# Patient Record
Sex: Male | Born: 1965 | Race: White | Hispanic: No | Smoking: Never smoker
Health system: Southern US, Community
[De-identification: ages and names within clinical notes are randomized; demographics above are authoritative.]

## PROBLEM LIST (undated history)

## (undated) DIAGNOSIS — I10 Essential (primary) hypertension: Secondary | ICD-10-CM

## (undated) DIAGNOSIS — M109 Gout, unspecified: Secondary | ICD-10-CM

## (undated) DIAGNOSIS — G4730 Sleep apnea, unspecified: Secondary | ICD-10-CM

## (undated) DIAGNOSIS — M1A09X0 Idiopathic chronic gout, multiple sites, without tophus (tophi): Secondary | ICD-10-CM

## (undated) DIAGNOSIS — F17200 Nicotine dependence, unspecified, uncomplicated: Secondary | ICD-10-CM

## (undated) DIAGNOSIS — E66812 Obesity, class 2: Secondary | ICD-10-CM

## (undated) DIAGNOSIS — R7989 Other specified abnormal findings of blood chemistry: Secondary | ICD-10-CM

## (undated) DIAGNOSIS — K760 Fatty (change of) liver, not elsewhere classified: Secondary | ICD-10-CM

## (undated) DIAGNOSIS — R7303 Prediabetes: Secondary | ICD-10-CM

## (undated) DIAGNOSIS — F419 Anxiety disorder, unspecified: Secondary | ICD-10-CM

## (undated) DIAGNOSIS — J342 Deviated nasal septum: Secondary | ICD-10-CM

## (undated) HISTORY — DX: Essential (primary) hypertension: I10

## (undated) HISTORY — DX: Gout, unspecified: M10.9

---

## 2008-07-30 ENCOUNTER — Ambulatory Visit: Payer: Self-pay | Admitting: Internal Medicine

## 2011-05-03 ENCOUNTER — Ambulatory Visit: Payer: Self-pay

## 2014-05-20 DIAGNOSIS — E669 Obesity, unspecified: Secondary | ICD-10-CM | POA: Insufficient documentation

## 2014-05-20 DIAGNOSIS — I8390 Asymptomatic varicose veins of unspecified lower extremity: Secondary | ICD-10-CM | POA: Insufficient documentation

## 2014-05-20 DIAGNOSIS — R030 Elevated blood-pressure reading, without diagnosis of hypertension: Secondary | ICD-10-CM | POA: Insufficient documentation

## 2014-05-20 DIAGNOSIS — E66812 Obesity, class 2: Secondary | ICD-10-CM | POA: Insufficient documentation

## 2014-06-01 DIAGNOSIS — E785 Hyperlipidemia, unspecified: Secondary | ICD-10-CM | POA: Insufficient documentation

## 2014-06-01 DIAGNOSIS — R7989 Other specified abnormal findings of blood chemistry: Secondary | ICD-10-CM | POA: Insufficient documentation

## 2014-07-09 ENCOUNTER — Ambulatory Visit: Payer: Self-pay | Admitting: Emergency Medicine

## 2015-06-02 LAB — HM HEPATITIS C SCREENING LAB: HM Hepatitis Screen: NEGATIVE

## 2016-04-18 DIAGNOSIS — G4733 Obstructive sleep apnea (adult) (pediatric): Secondary | ICD-10-CM | POA: Insufficient documentation

## 2016-04-18 LAB — COMPREHENSIVE METABOLIC PANEL
Albumin: 4.3 (ref 3.5–5.0)
Calcium: 9.3 (ref 8.7–10.7)

## 2016-04-18 LAB — CBC AND DIFFERENTIAL
HCT: 49 (ref 41–53)
Hemoglobin: 17.3 (ref 13.5–17.5)
Platelets: 221 10*3/uL (ref 150–400)
WBC: 5.4

## 2016-04-18 LAB — HEPATIC FUNCTION PANEL
ALT: 79 U/L — AB (ref 10–40)
AST: 61 — AB (ref 14–40)
Alkaline Phosphatase: 66 (ref 25–125)
Bilirubin, Direct: 0.29
Bilirubin, Total: 1.2

## 2016-04-18 LAB — TSH: TSH: 2.28 (ref 0.41–5.90)

## 2016-04-18 LAB — LIPID PANEL
Cholesterol: 260 — AB (ref 0–200)
HDL: 59 (ref 35–70)
LDL Cholesterol: 176
Triglycerides: 124 (ref 40–160)

## 2016-04-18 LAB — BASIC METABOLIC PANEL
BUN: 6 (ref 4–21)
CO2: 28 — AB (ref 13–22)
Chloride: 99 (ref 99–108)
Creatinine: 0.9 (ref 0.6–1.3)
Glucose: 118
Potassium: 4.1 mEq/L (ref 3.5–5.1)
Sodium: 141 (ref 137–147)

## 2016-04-18 LAB — CBC: RBC: 5.04 (ref 3.87–5.11)

## 2017-01-10 IMAGING — CR DG ANKLE COMPLETE 3+V*L*
3 series · 3 of 3 positions shown · non-contrast
Comparison: None.

CLINICAL DATA: Twisted ankle 2 weeks ago.  Ankle pain

EXAM:
LEFT ANKLE COMPLETE - 3+ VIEW

[ankle ap]
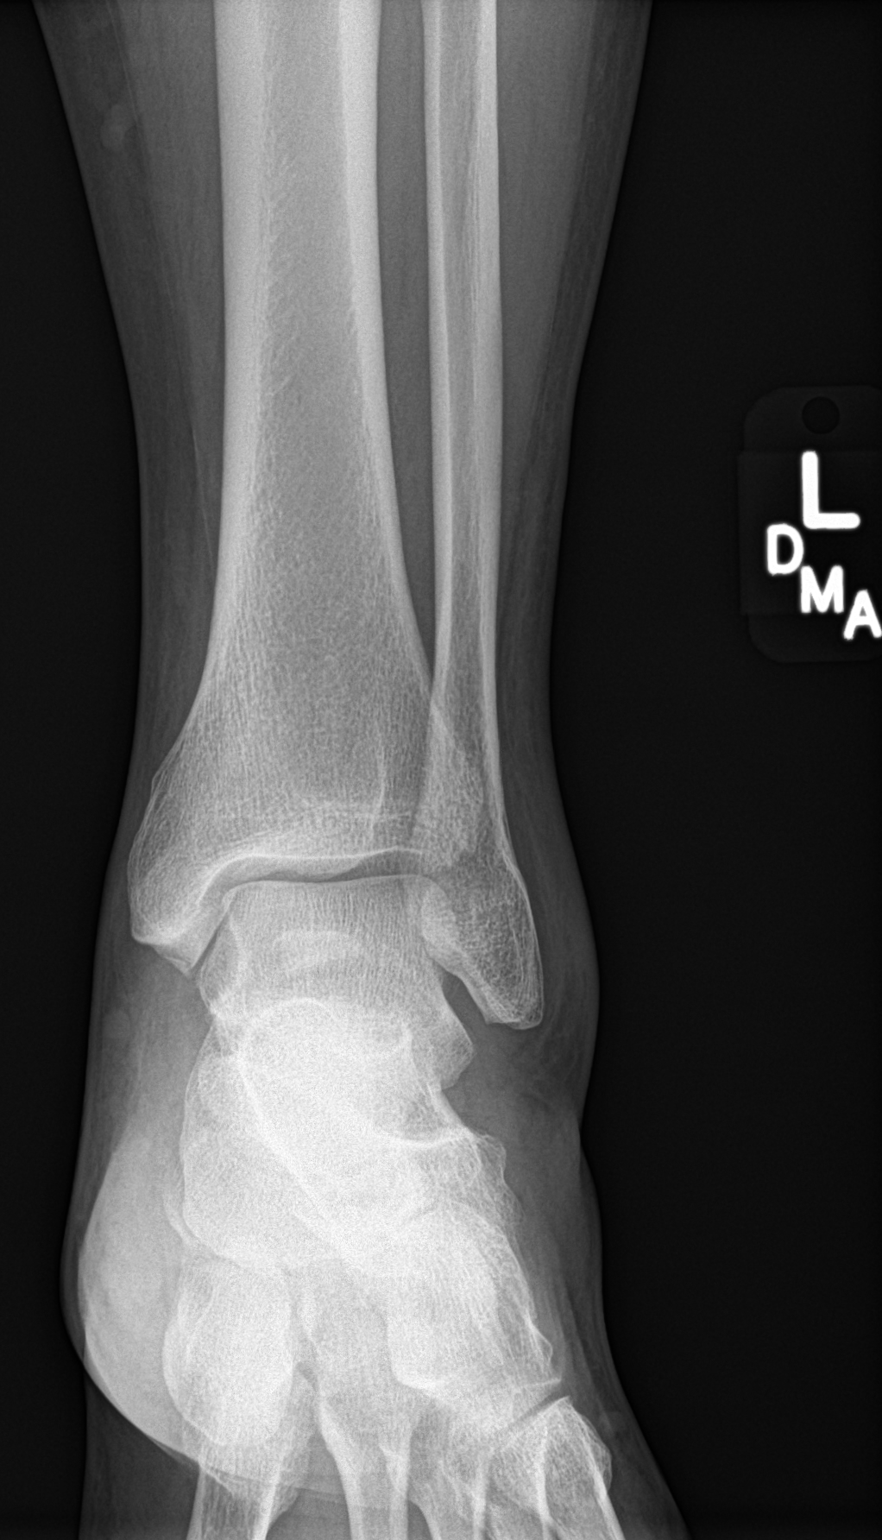

[ankle obl]
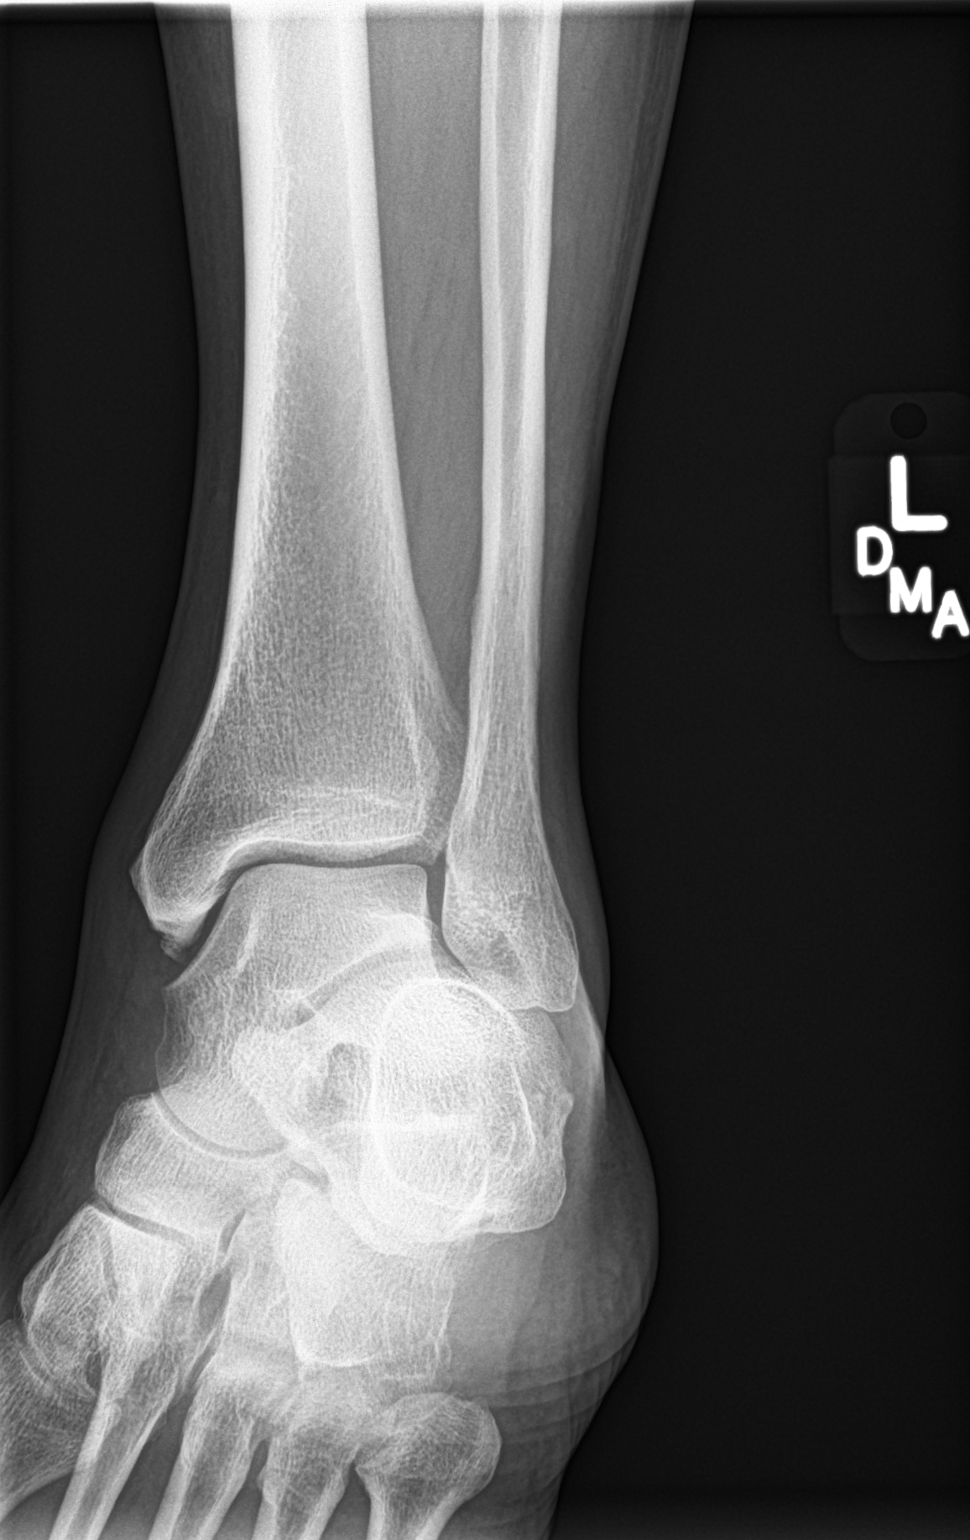

[ankle lat]
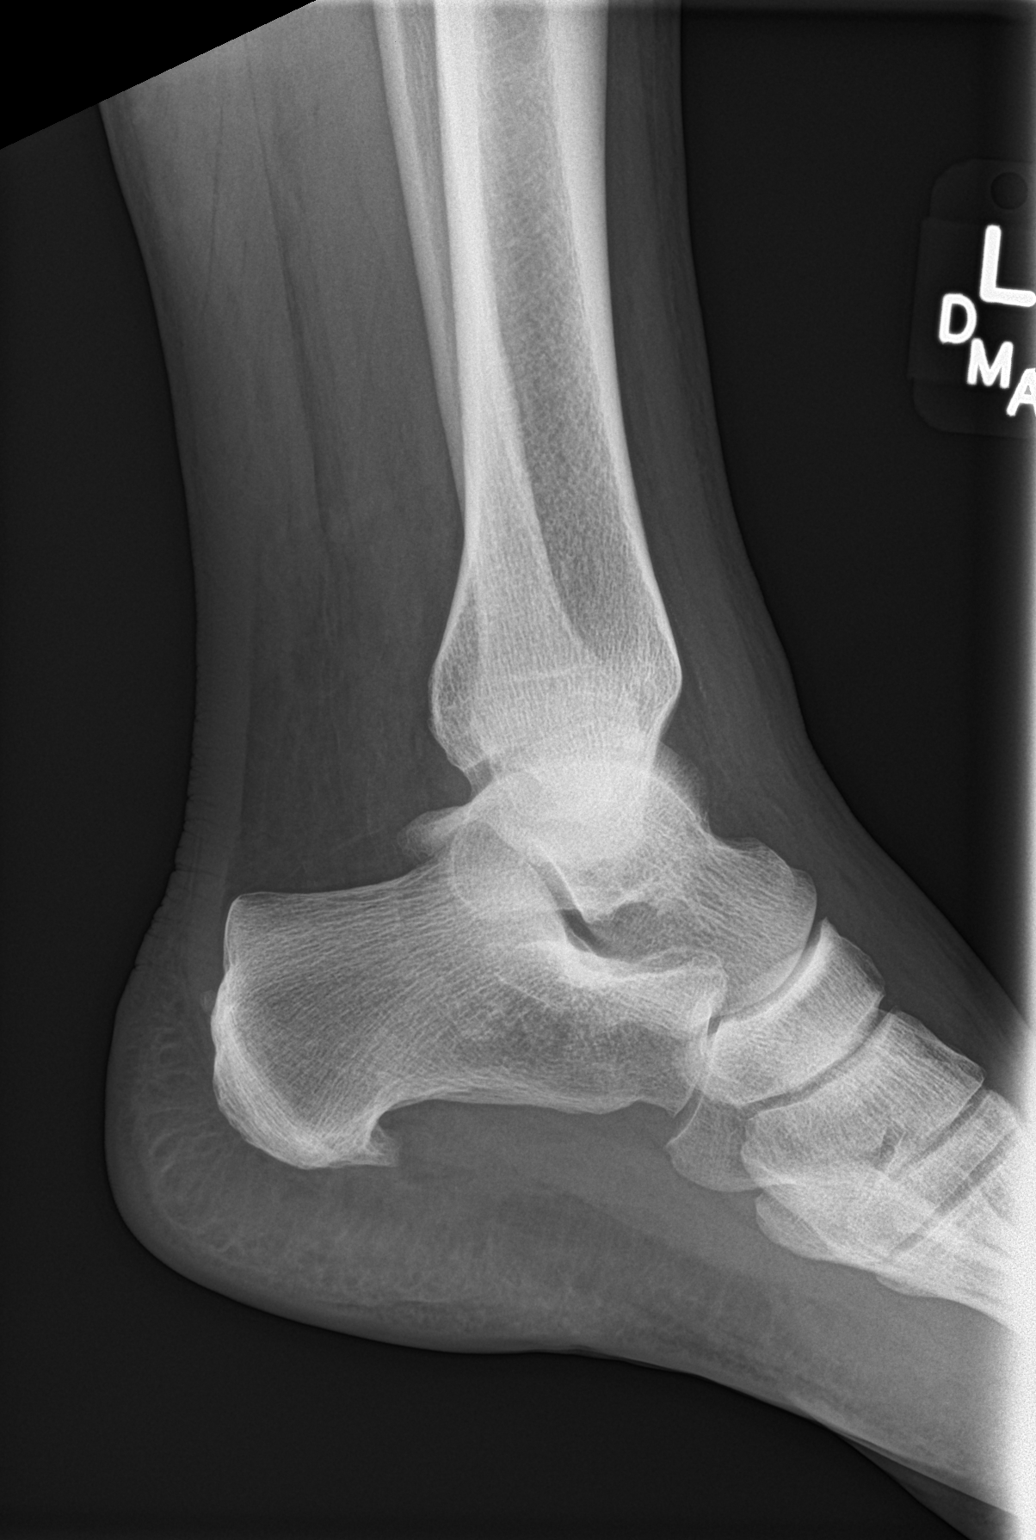

[3 of 3 positions shown; findings below may reference images not displayed]

FINDINGS: Small bony density adjacent to the medial malleolus likely is an
ossicle but correlate with pain in this area to exclude a fracture.
No effusion. No other fracture. Normal alignment no arthropathy.
IMPRESSION: Probable small ossicle adjacent to the medial malleolus. Recommend
attention to this area on exam.

## 2020-03-19 ENCOUNTER — Emergency Department
Admission: EM | Admit: 2020-03-19 | Discharge: 2020-03-19 | Disposition: A | Discharge status: Home / Self Care | Payer: PRIVATE HEALTH INSURANCE | Attending: Emergency Medicine | Admitting: Emergency Medicine

## 2020-03-19 DIAGNOSIS — F10129 Alcohol abuse with intoxication, unspecified: Secondary | ICD-10-CM | POA: Insufficient documentation

## 2020-03-19 DIAGNOSIS — E162 Hypoglycemia, unspecified: Secondary | ICD-10-CM | POA: Diagnosis not present

## 2020-03-19 DIAGNOSIS — F10920 Alcohol use, unspecified with intoxication, uncomplicated: Secondary | ICD-10-CM

## 2020-03-19 DIAGNOSIS — E860 Dehydration: Secondary | ICD-10-CM | POA: Diagnosis not present

## 2020-03-19 LAB — URINALYSIS, COMPLETE (UACMP) WITH MICROSCOPIC
Bacteria, UA: NONE SEEN
Bilirubin Urine: NEGATIVE
Glucose, UA: NEGATIVE mg/dL
Ketones, ur: NEGATIVE mg/dL
Leukocytes,Ua: NEGATIVE
Nitrite: NEGATIVE
Protein, ur: NEGATIVE mg/dL
Specific Gravity, Urine: 1.002 — ABNORMAL LOW (ref 1.005–1.030)
Squamous Epithelial / HPF: NONE SEEN (ref 0–5)
WBC, UA: NONE SEEN WBC/hpf (ref 0–5)
pH: 5 (ref 5.0–8.0)

## 2020-03-19 LAB — CBC WITH DIFFERENTIAL/PLATELET
Abs Immature Granulocytes: 0.03 10*3/uL (ref 0.00–0.07)
Basophils Absolute: 0.1 10*3/uL (ref 0.0–0.1)
Basophils Relative: 1 %
Eosinophils Absolute: 0.2 10*3/uL (ref 0.0–0.5)
Eosinophils Relative: 4 %
HCT: 46.2 % (ref 39.0–52.0)
Hemoglobin: 16.2 g/dL (ref 13.0–17.0)
Immature Granulocytes: 1 %
Lymphocytes Relative: 39 %
Lymphs Abs: 2.4 10*3/uL (ref 0.7–4.0)
MCH: 32.3 pg (ref 26.0–34.0)
MCHC: 35.1 g/dL (ref 30.0–36.0)
MCV: 92 fL (ref 80.0–100.0)
Monocytes Absolute: 0.6 10*3/uL (ref 0.1–1.0)
Monocytes Relative: 10 %
Neutro Abs: 2.7 10*3/uL (ref 1.7–7.7)
Neutrophils Relative %: 45 %
Platelets: 244 10*3/uL (ref 150–400)
RBC: 5.02 MIL/uL (ref 4.22–5.81)
RDW: 12.7 % (ref 11.5–15.5)
WBC: 6 10*3/uL (ref 4.0–10.5)
nRBC: 0 % (ref 0.0–0.2)

## 2020-03-19 LAB — COMPREHENSIVE METABOLIC PANEL
ALT: 16 U/L (ref 0–44)
AST: 24 U/L (ref 15–41)
Albumin: 4.4 g/dL (ref 3.5–5.0)
Alkaline Phosphatase: 52 U/L (ref 38–126)
Anion gap: 16 — ABNORMAL HIGH (ref 5–15)
BUN: 7 mg/dL (ref 6–20)
CO2: 19 mmol/L — ABNORMAL LOW (ref 22–32)
Calcium: 8.4 mg/dL — ABNORMAL LOW (ref 8.9–10.3)
Chloride: 98 mmol/L (ref 98–111)
Creatinine, Ser: 0.78 mg/dL (ref 0.61–1.24)
GFR calc Af Amer: >60 mL/min (ref >60)
GFR calc non Af Amer: >60 mL/min (ref >60)
Glucose, Bld: 104 mg/dL — ABNORMAL HIGH (ref 70–99)
Potassium: 3.4 mmol/L — ABNORMAL LOW (ref 3.5–5.1)
Sodium: 133 mmol/L — ABNORMAL LOW (ref 135–145)
Total Bilirubin: 0.8 mg/dL (ref 0.3–1.2)
Total Protein: 7.3 g/dL (ref 6.5–8.1)

## 2020-03-19 LAB — GLUCOSE, CAPILLARY: Glucose-Capillary: 91 mg/dL (ref 70–99)

## 2020-03-19 LAB — LIPASE, BLOOD: Lipase: 50 U/L (ref 11–51)

## 2020-03-19 LAB — ETHANOL: Alcohol, Ethyl (B): 264 mg/dL — ABNORMAL HIGH (ref <10)

## 2020-03-19 MED ORDER — SODIUM CHLORIDE 0.9 % IV BOLUS
1000.0000 mL | Freq: Once | INTRAVENOUS | Status: AC
Start: 2020-03-19 — End: 2020-03-19
  Administered 2020-03-19: 1000 mL via INTRAVENOUS

## 2020-03-19 NOTE — ED Triage Notes (Signed)
Patient found to be passed out in a drive thru at a local rest[employer]. Patient's sugar checked and was 53. Was given dextrose which raised his blood sugar to just over 100. Patient admits to drinking 6 beers this morning. Wife thinks he is an alcoholic.

## 2020-03-19 NOTE — ED Notes (Signed)
Dr Scotty Court to bedside to see pt. Pt up and ambulated down hall and back with no difficulty.

## 2020-03-19 NOTE — ED Provider Notes (Signed)
[facility] Emergency Department Provider Note  ____________________________________________  Time seen: Approximately 5:50 PM  I have reviewed the triage vital signs and the nursing notes.   HISTORY  Chief Complaint Alcohol Intoxication and Hypoglycemia    HPI Roger Huerta is a 54 y.o. male with no significant past medical history who was brought to the ED due to being found passed out in his car and a fast food drive-through line.  Blood sugar was checked by first responders and found to be 53.  He was given oral glucose which increased to about 100.  Denies any complaints, no chest pain shortness of breath abdominal pain vomiting or diarrhea.  Patient does note that he drinks about every other day.  He does not think that he is an alcoholic but notes that his wife does think so.  He reports drinking 6 beers this morning.  No smoking, no drug use.  Denies any blurry vision headache or dizziness currently.      History reviewed. No pertinent past medical history.   There are no problems to display for this patient.    History reviewed. No pertinent surgical history.   Prior to Admission medications   Not on File  None   Allergies Patient has no known allergies.   History reviewed. No pertinent family history.  Social History Social History   Tobacco Use  . Smoking status: Never Smoker  . Smokeless tobacco: Current User  Substance Use Topics  . Alcohol use: Yes    Alcohol/week: 6.0 standard drinks    Types: 6 Cans of beer per week    Comment: 6 beers daily  . Drug use: Not Currently    Review of Systems  Constitutional:   No fever or chills.  ENT:   No sore throat. No rhinorrhea. Cardiovascular:   No chest pain or syncope. Respiratory:   No dyspnea or cough. Gastrointestinal:   Negative for abdominal pain, vomiting and diarrhea.  Musculoskeletal:   Negative for focal pain or swelling All other systems reviewed and are  negative except as documented above in ROS and HPI.  ____________________________________________   PHYSICAL EXAM:  VITAL SIGNS: ED Triage Vitals  Enc Vitals Group     BP 03/19/20 1638 115/84     Pulse Rate 03/19/20 1638 79     Resp 03/19/20 1638 20     Temp 03/19/20 1638 99.1 F (37.3 C)     Temp Source 03/19/20 1638 Oral     SpO2 03/19/20 1638 98 %     Weight 03/19/20 1647 212 lb (96.2 kg)     Height 03/19/20 1647 5\' 7"  (1.702 m)     Head Circumference --      Peak Flow --      Pain Score 03/19/20 1639 0     Pain Loc --      Pain Edu? --      Excl. in GC? --     Vital signs reviewed, nursing assessments reviewed.   Constitutional:   Alert and oriented. Non-toxic appearance. Eyes:   Conjunctivae are normal. EOMI. PERRL. ENT      Head:   Normocephalic and atraumatic.      Nose:   Wearing a mask.      Mouth/Throat:   Wearing a mask.      Neck:   No meningismus. Full ROM. Hematological/Lymphatic/Immunilogical:   No cervical lymphadenopathy. Cardiovascular:   RRR. Symmetric bilateral radial and DP pulses.  No murmurs. Cap refill  less than 2 seconds. Respiratory:   Normal respiratory effort without tachypnea/retractions. Breath sounds are clear and equal bilaterally. No wheezes/rales/rhonchi. Gastrointestinal:   Soft and nontender. Non distended. There is no CVA tenderness.  No rebound, rigidity, or guarding.  Musculoskeletal:   Normal range of motion in all extremities. No joint effusions.  No lower extremity tenderness.  No edema. Neurologic:   Slurred speech.  Motor grossly intact. No acute focal neurologic deficits are appreciated.  Skin:    Skin is warm, dry and intact. No rash noted.  No petechiae, purpura, or bullae.  ____________________________________________    LABS (pertinent positives/negatives) (all labs ordered are listed, but only abnormal results are displayed) Labs Reviewed  COMPREHENSIVE METABOLIC PANEL - Abnormal; Notable for the following  components:      Result Value   Sodium 133 (*)    Potassium 3.4 (*)    CO2 19 (*)    Glucose, Bld 104 (*)    Calcium 8.4 (*)    Anion gap 16 (*)    All other components within normal limits  ETHANOL - Abnormal; Notable for the following components:   Alcohol, Ethyl (B) 264 (*)    All other components within normal limits  LIPASE, BLOOD  CBC WITH DIFFERENTIAL/PLATELET  URINALYSIS, COMPLETE (UACMP) WITH MICROSCOPIC   ____________________________________________   EKG    ____________________________________________    RADIOLOGY  No results found.  ____________________________________________   PROCEDURES Procedures  ____________________________________________    CLINICAL IMPRESSION / ASSESSMENT AND PLAN / ED COURSE  Medications ordered in the ED: Medications  sodium chloride 0.9 % bolus 1,000 mL (1,000 mLs Intravenous New Bag/Given 03/19/20 1704)    Pertinent labs & imaging results that were available during my care of the patient were reviewed by me and considered in my medical decision making (see chart for details).  Roger Huerta was evaluated in Emergency Department on 03/19/2020 for the symptoms described in the history of present illness. He was evaluated in the context of the global COVID-19 pandemic, which necessitated consideration that the patient might be at risk for infection with the SARS-CoV-2 virus that causes COVID-19. Institutional protocols and algorithms that pertain to the evaluation of patients at risk for COVID-19 are in a state of rapid change based on information released by regulatory bodies including the CDC and federal and state organizations. These policies and algorithms were followed during the patient's care in the ED.   Patient presents with hypoglycemia in the setting of alcohol binge drinking today, likely due to impaired gluconeogenesis and poor nutrition and dehydration.  Will check labs, give IV fluids for hydration and  food.  Will monitor for any signs of recurrent hypoglycemia.  Patient is not taking any hypoglycemic agents.   ----------------------------------------- 10:24 PM on 03/19/2020 -----------------------------------------  Blood sugar stable at 90.  Eating a second meal here in the ED, steady gait, no slurred speech now.  Stable for discharge home.  ____________________________________________   FINAL CLINICAL IMPRESSION(S) / ED DIAGNOSES    Final diagnoses:  Alcoholic intoxication without complication (HCC)  Dehydration     ED Discharge Orders    None      Portions of this note were generated with dragon dictation software. Dictation errors may occur despite best attempts at proofreading.   Sharman Cheek, MD 03/19/20 2224

## 2020-03-19 NOTE — ED Notes (Signed)
Patient tolerating IV fluids well. Denies need for anything at this time.

## 2022-12-27 ENCOUNTER — Ambulatory Visit (INDEPENDENT_AMBULATORY_CARE_PROVIDER_SITE_OTHER): Payer: Self-pay

## 2022-12-27 ENCOUNTER — Ambulatory Visit
Admission: EM | Admit: 2022-12-27 | Discharge: 2022-12-27 | Disposition: A | Discharge status: Home / Self Care | Payer: Self-pay | Attending: Physician Assistant | Admitting: Physician Assistant

## 2022-12-27 DIAGNOSIS — M25472 Effusion, left ankle: Secondary | ICD-10-CM

## 2022-12-27 DIAGNOSIS — M25562 Pain in left knee: Secondary | ICD-10-CM

## 2022-12-27 DIAGNOSIS — M25572 Pain in left ankle and joints of left foot: Secondary | ICD-10-CM

## 2022-12-27 DIAGNOSIS — M67873 Other specified disorders of tendon, right ankle and foot: Secondary | ICD-10-CM

## 2022-12-27 DIAGNOSIS — M109 Gout, unspecified: Secondary | ICD-10-CM

## 2022-12-27 MED ORDER — PREDNISONE 10 MG PO TABS: ORAL_TABLET | ORAL | 0 refills | Status: DC

## 2022-12-27 NOTE — ED Provider Notes (Signed)
[facility]    CSN: [id number] Arrival date & time: 12/27/22  1345      History   Chief Complaint Chief Complaint  Patient presents with   Ankle Pain    RT ankle    Knee Pain    LT knee    HPI Roger Huerta is a 57 y.o. male  presenting with his sister for multiple concerns.   Patient reports pain of right ankle pain x 6 weeks. Ankle pain is currently 5-6/10 but can get up to 8/10.  Pain worse with the foot dangling.  Patient better in full dorsiflexion.  Reports tingling of both feet.  Reports intermittent swelling of both ankles.  Reports he has been using a CAM boot and walker.   Reports left knee pain for about 5 weeks. Swelling comes and goes.  Patient denies any pain in his left knee at this time.  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  He says he thinks he could have twisted his ankle in the process of doing this.  He reports that he has not been working in the past 6 weeks since he started to have the pain.  He does have history of gout with last flare up 10 years ago.  He does report drinking alcohol every day.  Reports drinking several beers each day.  Reports drinking a 12 pack on the weekends.  History of fatty liver diagnosed about 7 years ago.  Has not followed up with primary care since.  Denies taking any OTC meds for symptoms.   HPI  History reviewed. No pertinent past medical history.  There are no problems to display for this patient.   History reviewed. No pertinent surgical history.     Home Medications    Prior to Admission medications   Medication Sig Start Date End Date Taking? Authorizing Provider  predniSONE (DELTASONE) 10 MG tablet Take 6 tabs po on day 1 and decrease daily until complete 12/27/22  Yes Shirlee Latch, PA-C    Family History No family history on file.  Social History Social History   Tobacco Use   Smoking status: Never   Smokeless tobacco: Current  Substance Use Topics    Alcohol use: Yes    Alcohol/week: 6.0 standard drinks of alcohol    Types: 6 Cans of beer per week    Comment: 6 beers daily   Drug use: Not Currently     Allergies   Hydrochlorothiazide   Review of Systems Review of Systems  Respiratory:  Negative for shortness of breath.   Cardiovascular:  Negative for chest pain.  Musculoskeletal:  Positive for arthralgias, gait problem and joint swelling. Negative for back pain.  Skin:  Positive for color change. Negative for wound.  Neurological:  Negative for weakness and numbness.     Physical Exam Triage Vital Signs ED Triage Vitals [12/27/22 1407]  Enc Vitals Group     BP (!) 143/92     Pulse Rate 92     Resp      Temp 98.4 F (36.9 C)     Temp Source Oral     SpO2 94 %     Weight      Height      Head Circumference      Peak Flow      Pain Score      Pain Loc      Pain Edu?      Excl. in GC?  No data found.  Updated Vital Signs BP (!) 143/92 (BP Location: Left Arm)   Pulse 92   Temp 98.4 F (36.9 C) (Oral)   SpO2 94%    Physical Exam Vitals and nursing note reviewed.  Constitutional:      General: He is not in acute distress.    Appearance: Normal appearance. He is well-developed. He is not ill-appearing.  HENT:     Head: Normocephalic and atraumatic.  Eyes:     General: No scleral icterus.    Conjunctiva/sclera: Conjunctivae normal.  Cardiovascular:     Rate and Rhythm: Normal rate and regular rhythm.     Pulses: Normal pulses.  Pulmonary:     Effort: Pulmonary effort is normal.  Musculoskeletal:     Cervical back: Neck supple.     Comments: Right ankle/foot.  Tenderness of medial and lateral malleoli.  Tenderness of insertion of Achilles.  Tenderness of first MTP with erythema.  Mild swelling of ankle.  Left knee: No swelling or tenderness and full range of motion.  Left ankle/foot: TTP of medial and lateral malleoli and plantar foot.  Skin:    General: Skin is warm and dry.     Capillary  Refill: Capillary refill takes less than 2 seconds.  Neurological:     General: No focal deficit present.     Mental Status: He is alert. Mental status is at baseline.     Motor: No weakness.     Gait: Gait abnormal (in a CAM boot).  Psychiatric:        Mood and Affect: Mood normal. Affect is flat.        Behavior: Behavior normal.      UC Treatments / Results  Labs (all labs ordered are listed, but only abnormal results are displayed) Labs Reviewed - No data to display  EKG   Radiology DG Ankle Complete Right  Result Date: 12/27/2022 CLINICAL DATA:  Left ankle twisting injury 6 weeks ago EXAM: RIGHT ANKLE - COMPLETE 3+ VIEW COMPARISON:  None available FINDINGS: No fracture or dislocation. Mild circumferential ankle swelling. Mild degenerative spurring of the medial and lateral malleoli. Enthesopathic changes are seen at the insertion of the plantar fascia and Achilles tendon on the calcaneus. Mild thickening of the distal Achilles tendon suspicious for tendinosis. Atherosclerotic changes seen throughout visualized arterial segments. IMPRESSION: Mild soft tissue swelling of the ankle without underlying fracture or dislocation. Electronically Signed   By: Acquanetta Belling M.D.   On: 12/27/2022 15:14   DG Knee Complete 4 Views Left  Result Date: 12/27/2022 CLINICAL DATA:  Left knee pain for 2 months EXAM: LEFT KNEE - COMPLETE 4+ VIEW COMPARISON:  None available FINDINGS: No fracture or dislocation. Atherosclerotic changes seen throughout visualized arterial segments. Joint spaces are maintained. IMPRESSION: 1. No fracture or dislocation. 2. No significant degenerative changes. Electronically Signed   By: Acquanetta Belling M.D.   On: 12/27/2022 15:12    Procedures Procedures (including critical care time)  Medications Ordered in UC Medications - No data to display  Initial Impression / Assessment and Plan / UC Course  I have reviewed the triage vital signs and the nursing notes.  Pertinent  labs & imaging results that were available during my care of the patient were reviewed by me and considered in my medical decision making (see chart for details).   57 year old male with history of alcohol abuse, gout, hyperlipidemia and fatty liver presents for intermittent swelling and pain of bilateral ankles and feet.  Currently reporting significant pain of the right ankle and foot over the past 6 weeks.  Denies known injury.  Has also been having pain and swelling intermittently of the left knee.  Not currently having knee pain.  On exam he does have swelling of bilateral ankles and tenderness of medial and lateral malleoli, insertion of right Achilles tendon and plantar left foot.  No tenderness or swelling of the left knee.  X-ray of right ankle and left knee obtained today.  No significant degenerative changes noted.  X-ray does show mild soft tissue swelling of the right ankle with mild thickening of the distal Achilles tendon which is suspicious for tendinosis.  He also has atherosclerotic changes in the arterial segment seen on the right ankle x-ray.  Reviewed results with patient and his sister.  Will treat him at this time for suspected gout flareup and Achilles tendinosis with prednisone taper.  Also advised Tylenol, RICE guidelines.  Advised the importance of cutting back on alcohol use.  Discussed following up with a primary care provider so that he may start something to help him quit drinking.  He is agreeable and nursing staff made him an appointment for primary care in a week.  Discussed that he may continue to be in gout flareups if he continues to drink.  Advised him to start folate and B12 supplement since he does have chronic alcohol use.  Advised to keep the appointment with PCP as they will likely draw routine labs.  Advised going to orthopedics sooner if his orthopedic complaints are worsening.   Final Clinical Impressions(s) / UC Diagnoses   Final diagnoses:  Acute gout of  right ankle, unspecified cause  Acute gout of right foot, unspecified cause  Acute pain of left knee  Pain and swelling of left ankle  Achilles tendinosis of right ankle     Discharge Instructions      -The x-ray does not show any fractures. - It does show that you have inflammation of the Achilles tendon of the right ankle.  No significant arthritis. - Symptoms may also be consistent with gout flareup.  We discussed that alcohol is a major trigger and you do consume alcohol frequently.  Try your best to decrease your consumption of alcohol.  Follow-up with her primary care provider to discuss her options with trying to stop drinking.  You also need to have routine lab work since it has been a while since you have seen a primary care provider. Start vitamin b12 and folate supplements over the counter. - I sent corticosteroids to the pharmacy to help with swelling/gout flareup.  Ice and elevate extremities.  May also take Tylenol for pain relief. - Follow-up with orthopedics if symptoms are worsening or not improving over the next week.     ED Prescriptions     Medication Sig Dispense Auth. Provider   predniSONE (DELTASONE) 10 MG tablet Take 6 tabs po on day 1 and decrease daily until complete 21 tablet Shirlee Latch, PA-C      PDMP not reviewed this encounter.   Shirlee Latch, PA-C 12/27/22 1539

## 2022-12-27 NOTE — ED Triage Notes (Addendum)
Pt presents to UC c/o RT ankle pain, pt states he sprained it about x6 weeks ago. Pt states he is still having pain. Denies any swelling. Pt is wearing a boot that he had from when he injured his heel years ago.  Pt also c/o LT knee pain, swelling onset mid May, pt states the swelling is not as bad today.

## 2022-12-27 NOTE — Discharge Instructions (Addendum)
-  The x-ray does not show any fractures. - It does show that you have inflammation of the Achilles tendon of the right ankle.  No significant arthritis. - Symptoms may also be consistent with gout flareup.  We discussed that alcohol is a major trigger and you do consume alcohol frequently.  Try your best to decrease your consumption of alcohol.  Follow-up with her primary care provider to discuss her options with trying to stop drinking.  You also need to have routine lab work since it has been a while since you have seen a primary care provider. Start vitamin b12 and folate supplements over the counter. - I sent corticosteroids to the pharmacy to help with swelling/gout flareup.  Ice and elevate extremities.  May also take Tylenol for pain relief. - Follow-up with orthopedics if symptoms are worsening or not improving over the next week.

## 2023-01-03 ENCOUNTER — Ambulatory Visit (INDEPENDENT_AMBULATORY_CARE_PROVIDER_SITE_OTHER): Payer: Self-pay | Admitting: Physician Assistant

## 2023-01-03 ENCOUNTER — Encounter: Payer: Self-pay | Admitting: Physician Assistant

## 2023-01-03 VITALS — BP 110/78 | HR 78 | Temp 98.1°F | Ht 67.0 in | Wt 215.0 lb

## 2023-01-03 DIAGNOSIS — F1020 Alcohol dependence, uncomplicated: Secondary | ICD-10-CM

## 2023-01-03 DIAGNOSIS — M1A09X0 Idiopathic chronic gout, multiple sites, without tophus (tophi): Secondary | ICD-10-CM

## 2023-01-03 DIAGNOSIS — Z13220 Encounter for screening for lipoid disorders: Secondary | ICD-10-CM

## 2023-01-03 DIAGNOSIS — K760 Fatty (change of) liver, not elsewhere classified: Secondary | ICD-10-CM

## 2023-01-03 DIAGNOSIS — E785 Hyperlipidemia, unspecified: Secondary | ICD-10-CM

## 2023-01-03 DIAGNOSIS — R453 Demoralization and apathy: Secondary | ICD-10-CM

## 2023-01-03 DIAGNOSIS — F1010 Alcohol abuse, uncomplicated: Secondary | ICD-10-CM | POA: Insufficient documentation

## 2023-01-03 DIAGNOSIS — Z6833 Body mass index (BMI) 33.0-33.9, adult: Secondary | ICD-10-CM

## 2023-01-03 DIAGNOSIS — R7989 Other specified abnormal findings of blood chemistry: Secondary | ICD-10-CM

## 2023-01-03 DIAGNOSIS — E66811 Obesity, class 1: Secondary | ICD-10-CM

## 2023-01-03 DIAGNOSIS — R251 Tremor, unspecified: Secondary | ICD-10-CM

## 2023-01-03 DIAGNOSIS — E669 Obesity, unspecified: Secondary | ICD-10-CM

## 2023-01-03 MED ORDER — BUPROPION HCL 100 MG PO TABS: 100.0000 mg | ORAL_TABLET | Freq: Two times a day (BID) | ORAL | 2 refills | Status: DC

## 2023-01-03 NOTE — Progress Notes (Signed)
Date:  01/03/2023   Name:  Roger Huerta   DOB:  [birth year]   MRN:  [id number]   Chief Complaint: Edema and Knee Pain (Has had 2 flare ups of gout in the past, with edema)  Knee Pain  Incident onset: X5 weeks. There was no injury mechanism. The pain is present in the left knee. The quality of the pain is described as stabbing. The pain is at a severity of 3/10. Pain course: comes and goes. Associated symptoms include muscle weakness. He reports no foreign bodies present. The symptoms are aggravated by movement. He has tried NSAIDs (prednsione) for the symptoms. The treatment provided mild relief.   Trip is a pleasant 57 y.o. with active alcohol use disorder, chronic gout, HLD, fatty liver disease, and probably mild depression new to me today to establish care and follow up on recent gout flare of left ankle and left knee evaluated at urgent care 12/27/22 and treated with prednisone, which has helped. He is joined by his sister Roger Huerta today. He is going through a divorce and the past few months have been very tough on him. He drinks heavily nearly every day, estimating 6-7 beers on weekdays and 12+ on each day of the weekend. He is uninsured and lost to primary care for years. He has had known alcohol use disorder for at least a decade on chart review. Alice asks if bupropion would be helpful, as she has heard anecdotally of its utility in reducing cravings. Trip tells me he has felt unmotivated and apathetic. He thinks if he could get back into the gym, this would help his mental state a lot. However, ambulating is difficult because of his joint pain. It sounds like his chronic uncontrolled gout has taken a toll on his joints.    Medication list has been reviewed and updated.  Current Meds  Medication Sig   buPROPion (WELLBUTRIN) 100 MG tablet Take 1 tablet (100 mg total) by mouth 2 (two) times daily. Use caution with current alcohol use. Minimize alcohol consumption while on this  medication.     Review of Systems  Constitutional:  Positive for fatigue. Negative for fever.  Respiratory:  Negative for chest tightness and shortness of breath.   Cardiovascular:  Negative for chest pain and palpitations.  Gastrointestinal:  Negative for abdominal pain.  Musculoskeletal:  Positive for arthralgias and gait problem.  Psychiatric/Behavioral:  Positive for dysphoric mood. Negative for sleep disturbance.     Patient Active Problem List   Diagnosis Date Noted   OSA (obstructive sleep apnea) 04/18/2016   Hyperlipidemia 06/01/2014   Elevated LFTs 06/01/2014   Obesity, Class II, BMI 35-39.9, isolated (see actual BMI) 05/20/2014   Varicose vein of leg 05/20/2014   Elevated blood pressure reading 05/20/2014    Allergies  Allergen Reactions   Hydrochlorothiazide     Other Reaction(s): Other (See Comments)  Gout    Immunization History  Administered Date(s) Administered   Influenza,inj,Quad PF,6+ Mos 05/20/2014, 04/18/2016   Tdap 05/20/2014    History reviewed. No pertinent surgical history.  Social History   Tobacco Use   Smoking status: Never   Smokeless tobacco: Current    Types: Chew  Vaping Use   Vaping status: Never Used  Substance Use Topics   Alcohol use: Yes    Alcohol/week: 6.0 standard drinks of alcohol    Types: 6 Cans of beer per week    Comment: 6 beers daily   Drug use: Not Currently  Family History  Problem Relation Age of Onset   Cancer Mother    Alzheimer's disease Mother    Stroke Father    Hypertension Father    Heart disease Father    Diabetes Father         01/03/2023    3:14 PM  GAD 7 : Generalized Anxiety Score  Nervous, Anxious, on Edge 0  Control/stop worrying 0  Worry too much - different things 1  Trouble relaxing 0  Restless 0  Easily annoyed or irritable 1  Afraid - awful might happen 0  Total GAD 7 Score 2  Anxiety Difficulty Not difficult at all       01/03/2023    3:14 PM  Depression screen PHQ  2/9  Decreased Interest 0  Down, Depressed, Hopeless 0  PHQ - 2 Score 0  Altered sleeping 2  Tired, decreased energy 1  Change in appetite 2  Feeling bad or failure about yourself  0  Trouble concentrating 0  Moving slowly or fidgety/restless 0  Suicidal thoughts 0  PHQ-9 Score 5  Difficult doing work/chores Not difficult at all    Flowsheet Row Office Visit from 01/03/2023 in [facility]  AUDIT-C Score 11          BP Readings from Last 3 Encounters:  01/03/23 110/78  12/27/22 (!) 143/92  03/19/20 115/72    Wt Readings from Last 3 Encounters:  01/03/23 215 lb (97.5 kg)  03/19/20 212 lb (96.2 kg)    BP 110/78   Pulse 78   Temp 98.1 F (36.7 C) (Oral)   Ht 5\' 7"  (1.702 m)   Wt 215 lb (97.5 kg)   SpO2 96%   BMI 33.67 kg/m   Physical Exam Vitals and nursing note reviewed.  Constitutional:      Appearance: Normal appearance.  Neck:     Vascular: No carotid bruit.  Cardiovascular:     Rate and Rhythm: Normal rate and regular rhythm.     Heart sounds: No murmur heard.    No friction rub. No gallop.  Pulmonary:     Effort: Pulmonary effort is normal.     Breath sounds: Normal breath sounds.  Abdominal:     General: There is no distension.  Musculoskeletal:        General: Normal range of motion.     Comments: Both feet with onychomycosis all toes. Mild TTP of left forefoot near the 1st MTP. Full AROM of the left ankle and left knee without pain. Some trouble ambulating, uses a walker today.   Skin:    General: Skin is warm and dry.  Neurological:     Mental Status: He is alert and oriented to person, place, and time.     Motor: Tremor present.     Gait: Gait is intact.     Comments: Mild tremor of both hands noted  Psychiatric:        Mood and Affect: Mood and affect normal.     Recent Labs     Component Value Date/Time   NA 133 (L) 03/19/2020 1702   NA 141 04/18/2016 0000   K 3.4 (L) 03/19/2020  1702   CL 98 03/19/2020 1702   CO2 19 (L) 03/19/2020 1702   GLUCOSE 104 (H) 03/19/2020 1702   BUN 7 03/19/2020 1702   BUN 6 04/18/2016 0000   CREATININE 0.78 03/19/2020 1702   CALCIUM 8.4 (L) 03/19/2020 1702   PROT  7.3 03/19/2020 1702   ALBUMIN 4.4 03/19/2020 1702   AST 24 03/19/2020 1702   ALT 16 03/19/2020 1702   ALKPHOS 52 03/19/2020 1702   BILITOT 0.8 03/19/2020 1702   GFRNONAA >60 03/19/2020 1702   GFRAA >60 03/19/2020 1702    Lab Results  Component Value Date   WBC 6.0 03/19/2020   HGB 16.2 03/19/2020   HCT 46.2 03/19/2020   MCV 92.0 03/19/2020   PLT 244 03/19/2020   No results found for: "HGBA1C" Lab Results  Component Value Date   CHOL 260 (A) 04/18/2016   HDL 59 04/18/2016   LDLCALC 176 04/18/2016   TRIG 124 04/18/2016   Lab Results  Component Value Date   TSH 2.28 04/18/2016     Assessment and Plan:  1. Chronic gout of multiple sites, unspecified cause Check uric acid today.  Discussed with patient likely need to use colchicine for flares and allopurinol for management of uric acid.  Discussed the relationship of alcohol and gout, and the importance of alcohol reduction and eventual cessation.  Plan to prescribe allopurinol, pending uric acid results. - Uric acid  2. Alcohol use disorder, severe, dependence (HCC) Patient struggling with severe alcohol use disorder exacerbated by recent divorce.  Need to check baseline labs, especially considering history of fatty liver disease with elevated LFTs.  Strongly encouraged gradual reduction of alcohol intake and eventual cessation.  May need pharmacotherapy to assist with this. - CBC with Differential/Platelet - Comprehensive metabolic panel  3. Elevated LFTs Repeat CMP today - Comprehensive metabolic panel  4. Apathy Negative PHQ today, but will trial low-dose bupropion to see if it may help with some of his apathetic/depressive symptoms.  May help with alcohol reduction.  - buPROPion (WELLBUTRIN) 100 MG  tablet; Take 1 tablet (100 mg total) by mouth 2 (two) times daily. Use caution with current alcohol use. Minimize alcohol consumption while on this medication.  Dispense: 30 tablet; Refill: 2  5. Hyperlipidemia, unspecified hyperlipidemia type Repeat lipid panel today - Lipid panel  6. Screening for hyperlipidemia Repeat lipid panel today - Lipid panel  7. Class 1 obesity with serious comorbidity and body mass index (BMI) of 33.0 to 33.9 in adult, unspecified obesity type Check baseline labs.  Patient encouraged to get back to the gym for routine physical activity - CBC with Differential/Platelet - Comprehensive metabolic panel - Lipid panel  8. Hepatic steatosis Repeating LFTs today, would likely benefit from repeat liver ultrasound but will wait for him to get health insurance before we do that.  9. Tremor Uncertain at this time if this is familial tremor which temporarily resolves with alcohol versus tremor as the result of alcohol withdrawal.  Monitor for now.    Return in about 6 weeks (around 02/14/2023) for OV gout, EtOH.    Alvester Morin, PA-C, DMSc, Nutritionist [facility] [phone]

## 2023-01-04 ENCOUNTER — Other Ambulatory Visit: Payer: Self-pay | Admitting: Physician Assistant

## 2023-01-04 DIAGNOSIS — M1A09X0 Idiopathic chronic gout, multiple sites, without tophus (tophi): Secondary | ICD-10-CM

## 2023-01-04 LAB — COMPREHENSIVE METABOLIC PANEL
ALT: 39 IU/L (ref 0–44)
AST: 50 IU/L — ABNORMAL HIGH (ref 0–40)
Albumin: 4.2 g/dL (ref 3.8–4.9)
Alkaline Phosphatase: 82 IU/L (ref 44–121)
BUN/Creatinine Ratio: 10 (ref 9–20)
BUN: 8 mg/dL (ref 6–24)
Bilirubin Total: 1.2 mg/dL (ref 0.0–1.2)
CO2: 27 mmol/L (ref 20–29)
Calcium: 9.5 mg/dL (ref 8.7–10.2)
Chloride: 93 mmol/L — ABNORMAL LOW (ref 96–106)
Creatinine, Ser: 0.81 mg/dL (ref 0.76–1.27)
Globulin, Total: 2.5 g/dL (ref 1.5–4.5)
Glucose: 110 mg/dL — ABNORMAL HIGH (ref 70–99)
Potassium: 4.7 mmol/L (ref 3.5–5.2)
Sodium: 139 mmol/L (ref 134–144)
Total Protein: 6.7 g/dL (ref 6.0–8.5)
eGFR: 103 mL/min/{1.73_m2} (ref >59)

## 2023-01-04 LAB — CBC WITH DIFFERENTIAL/PLATELET
Basophils Absolute: 0.1 10*3/uL (ref 0.0–0.2)
Basos: 1 %
EOS (ABSOLUTE): 0 10*3/uL (ref 0.0–0.4)
Eos: 0 %
Hematocrit: 52.8 % — ABNORMAL HIGH (ref 37.5–51.0)
Hemoglobin: 18.5 g/dL — ABNORMAL HIGH (ref 13.0–17.7)
Immature Grans (Abs): 0 10*3/uL (ref 0.0–0.1)
Immature Granulocytes: 1 %
Lymphocytes Absolute: 0.7 10*3/uL (ref 0.7–3.1)
Lymphs: 7 %
MCH: 34.5 pg — ABNORMAL HIGH (ref 26.6–33.0)
MCHC: 35 g/dL (ref 31.5–35.7)
MCV: 98 fL — ABNORMAL HIGH (ref 79–97)
Monocytes Absolute: 0.8 10*3/uL (ref 0.1–0.9)
Monocytes: 9 %
Neutrophils Absolute: 7.3 10*3/uL — ABNORMAL HIGH (ref 1.4–7.0)
Neutrophils: 82 %
Platelets: 279 10*3/uL (ref 150–450)
RBC: 5.37 x10E6/uL (ref 4.14–5.80)
RDW: 12 % (ref 11.6–15.4)
WBC: 8.8 10*3/uL (ref 3.4–10.8)

## 2023-01-04 LAB — URIC ACID: Uric Acid: 7.2 mg/dL (ref 3.8–8.4)

## 2023-01-04 LAB — LIPID PANEL
Chol/HDL Ratio: 2.2 ratio (ref 0.0–5.0)
Cholesterol, Total: 201 mg/dL — ABNORMAL HIGH (ref 100–199)
HDL: 91 mg/dL (ref >39)
LDL Chol Calc (NIH): 96 mg/dL (ref 0–99)
Triglycerides: 77 mg/dL (ref 0–149)
VLDL Cholesterol Cal: 14 mg/dL (ref 5–40)

## 2023-01-04 MED ORDER — ALLOPURINOL 100 MG PO TABS: 100.0000 mg | ORAL_TABLET | Freq: Every day | ORAL | 3 refills | Status: DC

## 2023-01-04 MED ORDER — COLCHICINE 0.6 MG PO TABS: 0.6000 mg | ORAL_TABLET | ORAL | 3 refills | Status: DC | PRN

## 2023-02-14 ENCOUNTER — Ambulatory Visit: Payer: Self-pay | Admitting: Physician Assistant

## 2023-02-21 ENCOUNTER — Ambulatory Visit (INDEPENDENT_AMBULATORY_CARE_PROVIDER_SITE_OTHER): Payer: Self-pay | Admitting: Physician Assistant

## 2023-02-21 ENCOUNTER — Encounter: Payer: Self-pay | Admitting: Physician Assistant

## 2023-02-21 VITALS — BP 110/78 | HR 96 | Temp 98.3°F | Ht 67.0 in | Wt 220.0 lb

## 2023-02-21 DIAGNOSIS — F1020 Alcohol dependence, uncomplicated: Secondary | ICD-10-CM

## 2023-02-21 NOTE — Progress Notes (Signed)
Date:  02/21/2023   Name:  Roger Huerta   DOB:  09-20-1965   MRN:  [id number]   Chief Complaint: Gout and Alcohol Problem  HPI Trip returns for 6wk f/u on gout and alcohol use disorder last seen by me 12/27/22 and prescribed allopurinol 100 mg for the gout with uric acid 7.2 and bupropion for mild depressive symptoms.  He has not started either medication and would like to avoid using allopurinol because he does not think that gout is the source of his pain, but instead neuropathy.  Because he is self-pay, we did not check any B vitamin levels last visit and instead I simply encouraged him to take a vitamin B complex, but he believes he is only taking B12 and iron.  He is not sure why he is supplementing with iron - no anemia or microcytosis on last labs.  He has been working on his alcohol consumption and currently reports drinking 3-4 beers per day with no liquor, which is improved since our last visit.  His last drink was yesterday afternoon.  He is not participating in any counseling, group therapy, or alcohol support groups.  No particular complaints today.  Recently sunburned from 2 weekends at the beach.  He has not yet gotten back in the gym, but plans to do it this week.  Currently self-pay but plans to enroll in [employer] through the [employer]. Has a good amount saved up in his HSA as well in case of emergency.    Medication list has been reviewed and updated.  No outpatient medications have been marked as taking for the 02/21/23 encounter (Office Visit) with Remo Lipps, PA.     Review of Systems  Constitutional:  Positive for fatigue. Negative for fever.  Respiratory:  Negative for chest tightness and shortness of breath.   Cardiovascular:  Negative for chest pain and palpitations.  Gastrointestinal:  Negative for abdominal pain.  Musculoskeletal:  Positive for arthralgias and gait problem.  Psychiatric/Behavioral:  Positive for dysphoric mood. Negative for sleep  disturbance.     Patient Active Problem List   Diagnosis Date Noted   Chronic gout of multiple sites 01/04/2023   Alcohol use disorder, severe, dependence (HCC) 01/03/2023   Hepatic steatosis 01/03/2023   OSA (obstructive sleep apnea) 04/18/2016   Hyperlipidemia 06/01/2014   Elevated LFTs 06/01/2014   Class 1 obesity with serious comorbidity and body mass index (BMI) of 33.0 to 33.9 in adult 05/20/2014   Varicose vein of leg 05/20/2014   Elevated blood pressure reading 05/20/2014    Allergies  Allergen Reactions   Hydrochlorothiazide     Other Reaction(s): Other (See Comments)  Gout    Immunization History  Administered Date(s) Administered   Influenza,inj,Quad PF,6+ Mos 05/20/2014, 04/18/2016   Tdap 05/20/2014    History reviewed. No pertinent surgical history.  Social History   Tobacco Use   Smoking status: Never   Smokeless tobacco: Current    Types: Chew  Vaping Use   Vaping status: Never Used  Substance Use Topics   Alcohol use: Yes    Alcohol/week: 4.0 standard drinks of alcohol    Types: 4 Cans of beer per week    Comment: 3-4beers daily   Drug use: Not Currently    Family History  Problem Relation Age of Onset   Cancer Mother    Alzheimer's disease Mother    Stroke Father    Hypertension Father    Heart disease Father  Diabetes Father         02/21/2023   10:40 AM 01/03/2023    3:14 PM  GAD 7 : Generalized Anxiety Score  Nervous, Anxious, on Edge 0 0  Control/stop worrying 0 0  Worry too much - different things 0 1  Trouble relaxing 0 0  Restless 0 0  Easily annoyed or irritable 0 1  Afraid - awful might happen 0 0  Total GAD 7 Score 0 2  Anxiety Difficulty Not difficult at all Not difficult at all       02/21/2023   10:40 AM 01/03/2023    3:14 PM  Depression screen PHQ 2/9  Decreased Interest 0 0  Down, Depressed, Hopeless 0 0  PHQ - 2 Score 0 0  Altered sleeping 0 2  Tired, decreased energy 1 1  Change in appetite 0 2  Feeling  bad or failure about yourself  0 0  Trouble concentrating 0 0  Moving slowly or fidgety/restless 0 0  Suicidal thoughts 0 0  PHQ-9 Score 1 5  Difficult doing work/chores Not difficult at all Not difficult at all    BP Readings from Last 3 Encounters:  02/21/23 110/78  01/03/23 110/78  12/27/22 (!) 143/92    Wt Readings from Last 3 Encounters:  02/21/23 220 lb (99.8 kg)  01/03/23 215 lb (97.5 kg)  03/19/20 212 lb (96.2 kg)    BP 110/78   Pulse 96   Temp 98.3 F (36.8 C) (Oral)   Ht 5\' 7"  (1.702 m)   Wt 220 lb (99.8 kg)   SpO2 96%   BMI 34.46 kg/m   Physical Exam Vitals and nursing note reviewed.  Constitutional:      Appearance: Normal appearance.  Cardiovascular:     Rate and Rhythm: Normal rate and regular rhythm.     Heart sounds: No murmur heard.    No friction rub. No gallop.  Pulmonary:     Effort: Pulmonary effort is normal.     Breath sounds: Normal breath sounds.  Abdominal:     General: Abdomen is flat. Bowel sounds are normal. There is no distension.     Palpations: Abdomen is soft.     Tenderness: There is no abdominal tenderness.  Musculoskeletal:        General: Normal range of motion.     Comments: Gait improved from last visit, no longer using a walker. There is mild tenderness of the right lower anterior ribs without overlying ecchymosis.  Skin:    General: Skin is warm and dry.     Comments: Diffuse erythema and flaking presumably from sunburn.  Most evident on the torso and face.  Neurological:     Mental Status: He is alert and oriented to person, place, and time.     Gait: Gait is intact.  Psychiatric:        Mood and Affect: Mood and affect normal.     Recent Labs     Component Value Date/Time   NA 139 01/03/2023 1622   K 4.7 01/03/2023 1622   CL 93 (L) 01/03/2023 1622   CO2 27 01/03/2023 1622   GLUCOSE 110 (H) 01/03/2023 1622   GLUCOSE 104 (H) 03/19/2020 1702   BUN 8 01/03/2023 1622   CREATININE 0.81 01/03/2023 1622    CALCIUM 9.5 01/03/2023 1622   PROT 6.7 01/03/2023 1622   ALBUMIN 4.2 01/03/2023 1622   AST 50 (H) 01/03/2023 1622   ALT 39 01/03/2023 1622   ALKPHOS 82  01/03/2023 1622   BILITOT 1.2 01/03/2023 1622   GFRNONAA >60 03/19/2020 1702   GFRAA >60 03/19/2020 1702    Lab Results  Component Value Date   WBC 8.8 01/03/2023   HGB 18.5 (H) 01/03/2023   HCT 52.8 (H) 01/03/2023   MCV 98 (H) 01/03/2023   PLT 279 01/03/2023   No results found for: "HGBA1C" Lab Results  Component Value Date   CHOL 201 (H) 01/03/2023   HDL 91 01/03/2023   LDLCALC 96 01/03/2023   TRIG 77 01/03/2023   CHOLHDL 2.2 01/03/2023   Lab Results  Component Value Date   TSH 2.28 04/18/2016     Assessment and Plan:  1. Alcohol use disorder, severe, dependence (HCC) Congratulated on alcohol reduction as we move toward total cessation. Patient plans to try bupropion - advised risk of side effects increases with alcohol consumption, so the less he drinks the better. He verbalizes understanding. Plan to check liver ultrasound once he is insured.    Return in about 2 months (around 04/23/2023) for OV f/u etoh.    Alvester Morin, PA-C, DMSc, Nutritionist [facility] [phone]

## 2023-04-23 ENCOUNTER — Ambulatory Visit: Payer: Self-pay | Admitting: Physician Assistant

## 2024-01-06 ENCOUNTER — Ambulatory Visit
Admission: EM | Admit: 2024-01-06 | Discharge: 2024-01-06 | Disposition: A | Discharge status: Home / Self Care | Attending: Physician Assistant | Admitting: Physician Assistant

## 2024-01-06 ENCOUNTER — Encounter: Payer: Self-pay | Admitting: Emergency Medicine

## 2024-01-06 DIAGNOSIS — R051 Acute cough: Secondary | ICD-10-CM

## 2024-01-06 DIAGNOSIS — K625 Hemorrhage of anus and rectum: Secondary | ICD-10-CM

## 2024-01-06 DIAGNOSIS — R21 Rash and other nonspecific skin eruption: Secondary | ICD-10-CM

## 2024-01-06 DIAGNOSIS — K649 Unspecified hemorrhoids: Secondary | ICD-10-CM

## 2024-01-06 DIAGNOSIS — J019 Acute sinusitis, unspecified: Secondary | ICD-10-CM

## 2024-01-06 DIAGNOSIS — Z800 Family history of malignant neoplasm of digestive organs: Secondary | ICD-10-CM

## 2024-01-06 MED ORDER — PROMETHAZINE-DM 6.25-15 MG/5ML PO SYRP: 5.0000 mL | ORAL_SOLUTION | Freq: Four times a day (QID) | ORAL | 0 refills | Status: DC | PRN

## 2024-01-06 MED ORDER — CLOTRIMAZOLE-BETAMETHASONE 1-0.05 % EX CREA: TOPICAL_CREAM | CUTANEOUS | 0 refills | Status: DC

## 2024-01-06 MED ORDER — CEFDINIR 300 MG PO CAPS: 300.0000 mg | ORAL_CAPSULE | Freq: Two times a day (BID) | ORAL | 0 refills | Status: AC

## 2024-01-06 MED ORDER — HYDROCORTISONE ACETATE 25 MG RE SUPP: 25.0000 mg | Freq: Two times a day (BID) | RECTAL | 0 refills | Status: DC

## 2024-01-06 NOTE — ED Provider Notes (Signed)
 [facility]    CSN: [id number] Arrival date & time: 01/06/24  1232      History   Chief Complaint Chief Complaint  Patient presents with   Rectal Bleeding    HPI Roger Huerta is a 58 y.o. male with history of hypertension, gout, hyperlipidemia, OSA, obesity, hepatic steatosis, and alcohol dependence presents for multiple complaints.  Patients reports diarrhea x 3-4 days. Patient says he had pasta 4 days ago and woke up the next morning with vomiting. No further vomiting but continued diarrhea. Reports seeing bright red blood in stool with each bowel movement. He says it is probably 2 teaspoons each time he has a bowel movements. Blood is mostly in the toilet bowl and on the toilet paper. No rectal pain or abdominal pain. Potentially exposed to c.diff about 1.5 weeks ago. Patients denies history of hemorrhoids or rectal bleeding. Patient has never had a colonoscopy but has been advised to do so by PCP. He says he needs to make an appointment to schedule it. He reports a cousin was recently diagnosed with rectal cancer.  Patient also reports cough and congestion x 1 week. Has had sinus issues for weeks. No fever, fatigue, body aches, sore throat, chest pain, shortness of breath.  Patient also reports that he has a rash on left lower leg for a few weeks. He says it is itchy but denies pain. Reports similar rashes with poison ivy or fungal infections.  HPI  Past Medical History:  Diagnosis Date   Gout    Hypertension     Patient Active Problem List   Diagnosis Date Noted   Chronic gout of multiple sites 01/04/2023   Alcohol use disorder, severe, dependence (HCC) 01/03/2023   Hepatic steatosis 01/03/2023   OSA (obstructive sleep apnea) 04/18/2016   Hyperlipidemia 06/01/2014   Elevated LFTs 06/01/2014   Class 1 obesity with serious comorbidity and body mass index (BMI) of 33.0 to 33.9 in adult 05/20/2014   Varicose vein of leg 05/20/2014   Elevated blood  pressure reading 05/20/2014    History reviewed. No pertinent surgical history.     Home Medications    Prior to Admission medications   Medication Sig Start Date End Date Taking? Authorizing Provider  cefdinir  (OMNICEF ) 300 MG capsule Take 1 capsule (300 mg total) by mouth 2 (two) times daily for 7 days. 01/06/24 01/13/24 Yes Arvis Jolan NOVAK, PA-C  clotrimazole -betamethasone  (LOTRISONE ) cream Apply to affected area 2 times daily prn 01/06/24  Yes Arvis Jolan NOVAK, PA-C  hydrocortisone  (ANUSOL -HC) 25 MG suppository Place 1 suppository (25 mg total) rectally 2 (two) times daily. 01/06/24  Yes Arvis Jolan NOVAK, PA-C  promethazine -dextromethorphan (PROMETHAZINE -DM) 6.25-15 MG/5ML syrup Take 5 mLs by mouth 4 (four) times daily as needed. 01/06/24  Yes Arvis Jolan NOVAK, PA-C  allopurinol  (ZYLOPRIM ) 100 MG tablet Take 1 tablet (100 mg total) by mouth daily. Patient not taking: Reported on 02/21/2023 01/04/23   Manya Toribio SQUIBB, PA  buPROPion  (WELLBUTRIN ) 100 MG tablet Take 1 tablet (100 mg total) by mouth 2 (two) times daily. Use caution with current alcohol use. Minimize alcohol consumption while on this medication. Patient not taking: Reported on 02/21/2023 01/03/23   Manya Toribio SQUIBB, PA  colchicine  0.6 MG tablet Take 1 tablet (0.6 mg total) by mouth as needed (Take only as directed at the onset of gout flare). Day 1: Take 2 tablets at once. After 1 hour, take a third tablet. Days 2-5: Take 1 tablet every 12h until  flare resolved Patient not taking: Reported on 02/21/2023 01/04/23   Manya Toribio SQUIBB, PA    Family History Family History  Problem Relation Age of Onset   Cancer Mother    Alzheimer's disease Mother    Stroke Father    Hypertension Father    Heart disease Father    Diabetes Father     Social History Social History   Tobacco Use   Smoking status: Never   Smokeless tobacco: Current    Types: Chew  Vaping Use   Vaping status: Never Used  Substance Use Topics   Alcohol use: Yes     Alcohol/week: 4.0 standard drinks of alcohol    Types: 4 Cans of beer per week    Comment: 3-4beers daily   Drug use: Not Currently     Allergies   Hydrochlorothiazide   Review of Systems Review of Systems  Constitutional:  Positive for fatigue. Negative for fever.  HENT:  Positive for congestion, rhinorrhea and sinus pressure. Negative for sore throat.   Respiratory:  Positive for cough. Negative for shortness of breath.   Cardiovascular:  Negative for chest pain.  Gastrointestinal:  Positive for anal bleeding, blood in stool, diarrhea, nausea and vomiting. Negative for abdominal distention, abdominal pain, constipation and rectal pain.  Skin:  Positive for rash.  Neurological:  Negative for dizziness, weakness and headaches.     Physical Exam Triage Vital Signs ED Triage Vitals  Encounter Vitals Group     BP 01/06/24 1307 (!) 165/84     Girls Systolic BP Percentile --      Girls Diastolic BP Percentile --      Boys Systolic BP Percentile --      Boys Diastolic BP Percentile --      Pulse Rate 01/06/24 1307 68     Resp 01/06/24 1307 16     Temp 01/06/24 1307 98.5 F (36.9 C)     Temp Source 01/06/24 1307 Oral     SpO2 01/06/24 1307 98 %     Weight 01/06/24 1306 220 lb 0.3 oz (99.8 kg)     Height 01/06/24 1306 5' 7 (1.702 m)     Head Circumference --      Peak Flow --      Pain Score 01/06/24 1306 0     Pain Loc --      Pain Education --      Exclude from Growth Chart --    No data found.  Updated Vital Signs BP (!) 165/84 (BP Location: Left Arm)   Pulse 68   Temp 98.5 F (36.9 C) (Oral)   Resp 16   Ht 5' 7 (1.702 m)   Wt 220 lb 0.3 oz (99.8 kg)   SpO2 98%   BMI 34.46 kg/m   Physical Exam Vitals and nursing note reviewed. Exam conducted with a chaperone present ([place], [place]).  Constitutional:      General: He is not in acute distress.    Appearance: Normal appearance. He is well-developed. He is not ill-appearing.     Comments: Patient coughs  frequently and forcefully  HENT:     Head: Normocephalic and atraumatic.     Nose: Congestion present.     Mouth/Throat:     Mouth: Mucous membranes are moist.     Pharynx: Oropharynx is clear.  Eyes:     General: No scleral icterus.    Conjunctiva/sclera: Conjunctivae normal.  Cardiovascular:     Rate and Rhythm: Normal rate and regular  rhythm.     Heart sounds: Normal heart sounds.  Pulmonary:     Effort: Pulmonary effort is normal. No respiratory distress.     Breath sounds: Normal breath sounds.  Abdominal:     Palpations: Abdomen is soft.     Tenderness: There is no abdominal tenderness. There is no right CVA tenderness, left CVA tenderness, guarding or rebound.  Genitourinary:    Rectum: Internal hemorrhoid present. No tenderness or anal fissure.   Musculoskeletal:     Cervical back: Neck supple.  Skin:    General: Skin is warm and dry.     Capillary Refill: Capillary refill takes less than 2 seconds.     Findings: Rash (faint erythematous dry patchy macular rash left anterior lower leg) present.  Neurological:     General: No focal deficit present.     Mental Status: He is alert. Mental status is at baseline.     Motor: No weakness.     Gait: Gait normal.  Psychiatric:        Mood and Affect: Mood normal.        Behavior: Behavior normal.      UC Treatments / Results  Labs (all labs ordered are listed, but only abnormal results are displayed) Labs Reviewed - No data to display  EKG   Radiology No results found.  Procedures Procedures (including critical care time)  Medications Ordered in UC Medications - No data to display  Initial Impression / Assessment and Plan / UC Course  I have reviewed the triage vital signs and the nursing notes.  Pertinent labs & imaging results that were available during my care of the patient were reviewed by me and considered in my medical decision making (see chart for details).   58 year old male with history of  hyperlipidemia, obesity, alcohol use disorder with severe dependence, hepatic steatosis, and gout presents for multiple complaints.  Initial complaint is numerous episodes of diarrhea with bright red blood over the past 3 to 4 days.  Denies associated abdominal pain or rectal pain.  No similar symptoms in the past.  Never had a colonoscopy but has been advised to have 1 by PCP. Family history of rectal cancer.  Patient also coughing and congested for the past week.  Has had sinus pressure for several weeks.  Has not been quite forcefully.  No fever.  Rash of left lower extremity.    Blood pressure elevated at 165/84.  Other vitals normal and stable.  Overall well-appearing.  Does cough often and forcefully.  Patient has nasal congestion.  Throat is clear.  Chest clear.  Heart regular rate and rhythm.  Abdomen soft and nontender.  Chaperone present for rectal exam.  Patient has what appears to be an internal hemorrhoid extending from the anus.  It is nontender.  There is surrounding dried blood.  He has a rash of his left anterior lower extremity which may be consistent with contact dermatitis versus tinea.  Since patient might have been exposed to C. difficile I did advise testing but he declines it would like to treat symptoms and monitor.  If no improvement he says he will return for reconsideration of stool studies.  Patient likely has an acute viral illness causing cough, congestion, diarrhea.  He has been coughing quite forcefully.  This combined with his history of alcohol use likely has increased his risk for hemorrhoids which are bleeding and causing blood to be noted in the toilet bowl.  Reviewed sitz bath's.  Sent  Anusol  to pharmacy.  Reviewed stool softeners.  Sent Promethazine  DM to help with cough.  Since he has had sinus issues for a while I sent in cefdinir  to treat for acute sinusitis.  Patient is advised to follow-up with PCP as he does need to have a colonoscopy and may need a referral for  that.  Lotrisone  sent to pharmacy for rash. Patient is agreeable.   Final Clinical Impressions(s) / UC Diagnoses   Final diagnoses:  Rectal bleeding  Hemorrhoids, unspecified hemorrhoid type  Acute cough  Acute sinusitis, recurrence not specified, unspecified location  Rash and nonspecific skin eruption  Family history of colon cancer     Discharge Instructions      - You do have a large hemorrhoid.  It looks to be internal so I sent Anusol  suppositories.  You should also use over-the-counter topical Preparation H to apply directly to the anus. - Warm sitz baths/soaks. - Consider starting a stool softener such as senna and try not to cough so forcefully as I think that has been somewhat led to your symptoms.  I sent in a medicine for you.  Additionally since you have been congested for a while with sinus issues will treat for sinus infection.  Sent medication called cefdinir  to pharmacy for this.  Lots of rest and fluids. - Rash may be consistent with contact dermatitis or fungal infection so I sent a cream that we will treat it is called Lotrisone . - Please reach back out to your PCP about a referral for the colonoscopy as you are definitely overdue for it.  -If fever, abdominal pain, worsening diarrhea or heavier rectal bleeding go to ER -If continued diarrhea and not feeling well in a few more day, return so we can check your stool for bacteria and viruses including c. Diff.     ED Prescriptions     Medication Sig Dispense Auth. Provider   clotrimazole -betamethasone  (LOTRISONE ) cream Apply to affected area 2 times daily prn 45 g Arvis, Rashunda Passon B, PA-C   cefdinir  (OMNICEF ) 300 MG capsule Take 1 capsule (300 mg total) by mouth 2 (two) times daily for 7 days. 14 capsule Arvis Huxley B, PA-C   promethazine -dextromethorphan (PROMETHAZINE -DM) 6.25-15 MG/5ML syrup Take 5 mLs by mouth 4 (four) times daily as needed. 118 mL Arvis Huxley B, PA-C   hydrocortisone  (ANUSOL -HC) 25 MG  suppository Place 1 suppository (25 mg total) rectally 2 (two) times daily. 12 suppository Arvis Huxley NOVAK, PA-C      PDMP not reviewed this encounter.   Arvis Huxley NOVAK, PA-C 01/06/24 [phone]

## 2024-01-06 NOTE — Discharge Instructions (Addendum)
-   You do have a large hemorrhoid.  It looks to be internal so I sent Anusol  suppositories.  You should also use over-the-counter topical Preparation H to apply directly to the anus. - Warm sitz baths/soaks. - Consider starting a stool softener such as senna and try not to cough so forcefully as I think that has been somewhat led to your symptoms.  I sent in a medicine for you.  Additionally since you have been congested for a while with sinus issues will treat for sinus infection.  Sent medication called cefdinir  to pharmacy for this.  Lots of rest and fluids. - Rash may be consistent with contact dermatitis or fungal infection so I sent a cream that we will treat it is called Lotrisone . - Please reach back out to your PCP about a referral for the colonoscopy as you are definitely overdue for it.  -If fever, abdominal pain, worsening diarrhea or heavier rectal bleeding go to ER -If continued diarrhea and not feeling well in a few more day, return so we can check your stool for bacteria and viruses including c. Diff.

## 2024-01-06 NOTE — ED Triage Notes (Signed)
 Pt c/o blood in stool. Started about 3 days ago. He states it is bright red blood in the toilet and on the toilet paper. He states he has had a lot of diarrhea the 4-5 days. 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 Denies abdominal pain, nausea or fever.

## 2024-05-08 ENCOUNTER — Encounter: Payer: Self-pay | Admitting: Physician Assistant

## 2024-05-08 ENCOUNTER — Ambulatory Visit (INDEPENDENT_AMBULATORY_CARE_PROVIDER_SITE_OTHER): Admitting: Physician Assistant

## 2024-05-08 VITALS — BP 110/72 | HR 78 | Temp 98.2°F | Ht 67.0 in | Wt 240.0 lb

## 2024-05-08 DIAGNOSIS — B353 Tinea pedis: Secondary | ICD-10-CM | POA: Insufficient documentation

## 2024-05-08 DIAGNOSIS — Z6837 Body mass index (BMI) 37.0-37.9, adult: Secondary | ICD-10-CM

## 2024-05-08 DIAGNOSIS — G4733 Obstructive sleep apnea (adult) (pediatric): Secondary | ICD-10-CM

## 2024-05-08 DIAGNOSIS — J342 Deviated nasal septum: Secondary | ICD-10-CM | POA: Diagnosis not present

## 2024-05-08 DIAGNOSIS — F1020 Alcohol dependence, uncomplicated: Secondary | ICD-10-CM

## 2024-05-08 DIAGNOSIS — E66812 Obesity, class 2: Secondary | ICD-10-CM

## 2024-05-08 DIAGNOSIS — B351 Tinea unguium: Secondary | ICD-10-CM | POA: Insufficient documentation

## 2024-05-08 DIAGNOSIS — M1A09X0 Idiopathic chronic gout, multiple sites, without tophus (tophi): Secondary | ICD-10-CM

## 2024-05-08 DIAGNOSIS — Z0000 Encounter for general adult medical examination without abnormal findings: Secondary | ICD-10-CM | POA: Diagnosis not present

## 2024-05-08 DIAGNOSIS — Z125 Encounter for screening for malignant neoplasm of prostate: Secondary | ICD-10-CM | POA: Diagnosis not present

## 2024-05-08 DIAGNOSIS — Z1211 Encounter for screening for malignant neoplasm of colon: Secondary | ICD-10-CM | POA: Diagnosis not present

## 2024-05-08 DIAGNOSIS — Z23 Encounter for immunization: Secondary | ICD-10-CM | POA: Diagnosis not present

## 2024-05-08 NOTE — Progress Notes (Signed)
 Date:  05/08/2024   Name:  Roger Huerta   DOB:  1966/05/07   MRN:  [id number]   Chief Complaint: Annual Exam  HPI  Trip presents today for routine physical exam.  Last visit with me was a little over a year ago, at that time he was self-pay and we were trying to manage his alcohol use disorder.  Last we discussed, he requested to try bupropion  to help and quit both alcohol and tobacco; he obtained this prescription but never used it.  He still has it in his possession.  Last Dental Exam: 5y ago Last Eye Exam: 13y ago. Had LASIK OD Last CRC screen: never Last PSA: unknown Immunizations Due: flu, Shingrix, Prevnar 20  Hasn't eaten in about 24 hours. Says he binges (I pig out), then goes about 2 days without eating. Drinks alcohol instead.  Tells me during the week, he drinks 3-4 beers every other day and on weekends he drinks about 12 beers each on Saturday and "Sunday.  He has gained about 25 pounds since his visit with me last year.  Known OSA, was not able to tolerate mask.  Mentions toenail fungus, previously treated with terbinafine for 3 months, but when he did not see much progress he got concerned about his liver function and stopped.  Presently he is not using any medications at all.  Does not like to take medicines in general.  Medication list has been reviewed and updated.  No outpatient medications have been marked as taking for the 05/08/24 encounter (Office Visit) with Vesper Trant P, PA.     Review of Systems  Patient Active Problem List   Diagnosis Date Noted   Deviated nasal septum 05/08/2024   Tinea pedis of both feet 05/08/2024   Onychomycosis 05/08/2024   Chronic gout of multiple sites 01/04/2023   Alcohol use disorder, severe, dependence (HCC) 01/03/2023   Hepatic steatosis 01/03/2023   OSA (obstructive sleep apnea) 04/18/2016   Hyperlipidemia 06/01/2014   Elevated LFTs 06/01/2014   Class 2 severe obesity with serious comorbidity in  adult 05/20/2014   Varicose vein of leg 05/20/2014   Elevated blood pressure reading 05/20/2014    Allergies  Allergen Reactions   Hydrochlorothiazide     Other Reaction(s): Other (See Comments)  Gout    Immunization History  Administered Date(s) Administered   Influenza, Seasonal, Injecte, Preservative Fre 05/08/2024   Influenza,inj,Quad PF,6+ Mos 05/20/2014, 04/18/2016   PNEUMOCOCCAL CONJUGATE-20 05/08/2024   Tdap 05/20/2014    History reviewed. No pertinent surgical history.  Social History   Tobacco Use   Smoking status: Never   Smokeless tobacco: Current    Types: Chew   Tobacco comments:    Dip  Vaping Use   Vaping status: Never Used  Substance Use Topics   Alcohol use: Yes    Alcohol/week: 4.0 standard drinks of alcohol    Types: 4 Cans of beer per week    Comment: 3-4 beers every other day, 12-pack each Sat/Sun   Drug use: Not Currently    Family History  Problem Relation Age of Onset   Cancer Mother    Alzheimer's disease Mother    Stroke Father    Hypertension Father    Heart disease Father    Diabetes Father         11" /20/2025    2:56 PM 02/21/2023   10:40 AM 01/03/2023    3:14 PM  GAD 7 : Generalized Anxiety Score  Nervous, Anxious, on  Edge 0 0 0  Control/stop worrying 0 0 0  Worry too much - different things 0 0 1  Trouble relaxing 0 0 0  Restless 0 0 0  Easily annoyed or irritable 0 0 1  Afraid - awful might happen 0 0 0  Total GAD 7 Score 0 0 2  Anxiety Difficulty Not difficult at all Not difficult at all Not difficult at all       05/08/2024    2:56 PM 02/21/2023   10:40 AM 01/03/2023    3:14 PM  Depression screen PHQ 2/9  Decreased Interest 0 0 0  Down, Depressed, Hopeless 0 0 0  PHQ - 2 Score 0 0 0  Altered sleeping  0 2  Tired, decreased energy  1 1  Change in appetite  0 2  Feeling bad or failure about yourself   0 0  Trouble concentrating  0 0  Moving slowly or fidgety/restless  0 0  Suicidal thoughts  0 0  PHQ-9 Score   1  5   Difficult doing work/chores  Not difficult at all Not difficult at all     Data saved with a previous flowsheet row definition    BP Readings from Last 3 Encounters:  05/08/24 110/72  01/06/24 (!) 165/84  02/21/23 110/78    Wt Readings from Last 3 Encounters:  05/08/24 240 lb (108.9 kg)  01/06/24 220 lb 0.3 oz (99.8 kg)  02/21/23 220 lb (99.8 kg)    BP 110/72   Pulse 78   Temp 98.2 F (36.8 C)   Ht 5' 7 (1.702 m)   Wt 240 lb (108.9 kg)   SpO2 96%   BMI 37.59 kg/m   Physical Exam Vitals and nursing note reviewed.  Constitutional:      Appearance: Normal appearance.  HENT:     Ears:     Comments: EAC clear bilaterally with good view of TM which is without effusion or erythema.     Nose: Septal deviation (moderate, toward patient left) present.     Mouth/Throat:     Mouth: Mucous membranes are moist. No oral lesions.     Dentition: Abnormal dentition (tobacco staining, one chipped right inferior molar).     Pharynx: No posterior oropharyngeal erythema.  Eyes:     Extraocular Movements: Extraocular movements intact.     Conjunctiva/sclera: Conjunctivae normal.     Pupils: Pupils are equal, round, and reactive to light.  Neck:     Thyroid: No thyromegaly.  Cardiovascular:     Rate and Rhythm: Normal rate and regular rhythm.     Heart sounds: No murmur heard.    No friction rub. No gallop.     Comments: Pulses 2+ at radial, PT, DP bilaterally. No carotid bruit. No peripheral edema Pulmonary:     Effort: Pulmonary effort is normal.     Breath sounds: Normal breath sounds.  Abdominal:     General: Abdomen is protuberant. Bowel sounds are normal.     Palpations: Abdomen is soft. There is no mass.     Tenderness: There is no abdominal tenderness.  Genitourinary:    Prostate: Normal. Not enlarged, not tender and no nodules present.     Rectum: Normal. Guaiac result negative. No mass.  Musculoskeletal:     Comments: Full ROM with strength 5/5 bilateral upper  and lower extremities  Feet:     Right foot:     Toenail Condition: Right toenails are abnormally thick. Fungal disease present.  Left foot:     Toenail Condition: Left toenails are abnormally thick. Fungal disease present.    Comments: Onychomycosis with yellow discoloration and thickening of all 10 toenails.  Tinea pedis of bilateral fourth webspaces. Lymphadenopathy:     Cervical: No cervical adenopathy.  Skin:    General: Skin is warm.     Capillary Refill: Capillary refill takes less than 2 seconds.     Findings: No lesion or rash.  Neurological:     Mental Status: He is alert and oriented to person, place, and time.     Gait: Gait is intact.  Psychiatric:        Mood and Affect: Mood normal.        Behavior: Behavior normal.     Recent Labs     Component Value Date/Time   NA 139 01/03/2023 1622   K 4.7 01/03/2023 1622   CL 93 (L) 01/03/2023 1622   CO2 27 01/03/2023 1622   GLUCOSE 110 (H) 01/03/2023 1622   GLUCOSE 104 (H) 03/19/2020 1702   BUN 8 01/03/2023 1622   CREATININE 0.81 01/03/2023 1622   CALCIUM 9.5 01/03/2023 1622   PROT 6.7 01/03/2023 1622   ALBUMIN 4.2 01/03/2023 1622   AST 50 (H) 01/03/2023 1622   ALT 39 01/03/2023 1622   ALKPHOS 82 01/03/2023 1622   BILITOT 1.2 01/03/2023 1622   GFRNONAA >60 03/19/2020 1702   GFRAA >60 03/19/2020 1702    Lab Results  Component Value Date   WBC 8.8 01/03/2023   HGB 18.5 (H) 01/03/2023   HCT 52.8 (H) 01/03/2023   MCV 98 (H) 01/03/2023   PLT 279 01/03/2023   No results found for: HGBA1C Lab Results  Component Value Date   CHOL 201 (H) 01/03/2023   HDL 91 01/03/2023   LDLCALC 96 01/03/2023   TRIG 77 01/03/2023   CHOLHDL 2.2 01/03/2023   Lab Results  Component Value Date   TSH 2.28 04/18/2016      Assessment and Plan:  1. Annual physical exam (Primary) Encouraged healthy lifestyle including regular physical activity and consumption of whole fruits and vegetables. Encouraged routine dental and eye  exams.   Checking fasting labs today  - Lipid panel - Uric acid - PSA - CBC with Differential/Platelet - Comprehensive metabolic panel with GFR - TSH - Hemoglobin A1c  2. Screening PSA (prostate specific antigen) - PSA  3. Screening for colon cancer Referring for colonoscopy as initial CRC screen in this patient with delayed screening and history of significant tobacco and alcohol use.  Patient in agreement with plan - Ambulatory referral to Gastroenterology  4. Deviated nasal septum Briefly discussed today, could be contributing to his snoring/apnea.  At this time he is not interested in correction, but was informed that I am happy to submit an ENT referral at any time.  5. OSA (obstructive sleep apnea) Alcohol and excess weight both certainly contributory.  Did not tolerate CPAP in the past, but might be worth considering alternative mask type.  For now, focus on weight reduction and alcohol reduction.   6. Alcohol use disorder, severe, dependence (HCC) Discussed excessive use and elevated liver enzymes on testing last year.  Will repeat labs today, but probably needs a liver ultrasound to assess the liver integrity.  7. Chronic gout of multiple sites, unspecified cause Checking uric acid today.  Was on allopurinol  for the time, but stopped.  Again, alcohol reduction/cessation would be hugely beneficial for this problem.  8. Class 2 severe obesity  due to excess calories with serious comorbidity and body mass index (BMI) of 37.0 to 37.9 in adult Check for metabolic comorbidities.  Patient is surprised by his 25 pound weight gain and seems eager to manage his weight more effectively.  We discussed the high calorie content of alcohols, especially with his rate of consumption.  This be a really great place to start.  Also room for improvement in other aspects of life.  Might qualify for Zepbound, though with his current insurance, I suspect he will require relatively recent sleep study  with moderate OSA and BMI greater than 40 to qualify.  Will reserve this as a topic of future discussion  9. Onychomycosis Discussed that terbinafine typically needs to be used for 6 months duration for the toenails with more advanced onychomycosis.  However, there are liver concerns based on prior lab work and his ongoing alcohol use.  Will focus our attention on alcohol reduction and circle back to treating the onychomycosis at a later date  10. Tinea pedis of both feet Encouraged OTC antifungal such as topical clotrimazole  or terbinafine  11. Encounter for immunization Flu shot and Prevnar 20 administered today.  Will also soon be due for Tdap.  Will plan for Tdap and Shingrix #1 next OV, which we will arrange for a Friday.  - Flu vaccine trivalent PF, 6mos and older(Flulaval,Afluria,Fluarix,Fluzone) - Pneumococcal conjugate vaccine 20-valent    Return in about 29 days (around 06/06/2024) for OV f/u EtOH. Tdap, Shingrix #1   Rolan Hoyle, PA-C, DMSc, Nutritionist [facility] [phone]

## 2024-05-09 ENCOUNTER — Ambulatory Visit: Payer: Self-pay | Admitting: Physician Assistant

## 2024-05-09 LAB — COMPREHENSIVE METABOLIC PANEL WITH GFR
ALT: 64 IU/L — ABNORMAL HIGH (ref 0–44)
AST: 162 IU/L — ABNORMAL HIGH (ref 0–40)
Albumin: 4.1 g/dL (ref 3.8–4.9)
Alkaline Phosphatase: 77 IU/L (ref 47–123)
BUN/Creatinine Ratio: 8 — ABNORMAL LOW (ref 9–20)
BUN: 5 mg/dL — ABNORMAL LOW (ref 6–24)
Bilirubin Total: 0.9 mg/dL (ref 0.0–1.2)
CO2: 22 mmol/L (ref 20–29)
Calcium: 9 mg/dL (ref 8.7–10.2)
Chloride: 99 mmol/L (ref 96–106)
Creatinine, Ser: 0.65 mg/dL — ABNORMAL LOW (ref 0.76–1.27)
Globulin, Total: 2.5 g/dL (ref 1.5–4.5)
Glucose: 99 mg/dL (ref 70–99)
Potassium: 4.1 mmol/L (ref 3.5–5.2)
Sodium: 139 mmol/L (ref 134–144)
Total Protein: 6.6 g/dL (ref 6.0–8.5)
eGFR: 110 mL/min/1.73 (ref >59)

## 2024-05-09 LAB — CBC WITH DIFFERENTIAL/PLATELET
Basophils Absolute: 0.1 x10E3/uL (ref 0.0–0.2)
Basos: 3 %
EOS (ABSOLUTE): 0.2 x10E3/uL (ref 0.0–0.4)
Eos: 4 %
Hematocrit: 47.8 % (ref 37.5–51.0)
Hemoglobin: 16 g/dL (ref 13.0–17.7)
Immature Grans (Abs): 0 x10E3/uL (ref 0.0–0.1)
Immature Granulocytes: 0 %
Lymphocytes Absolute: 1.5 x10E3/uL (ref 0.7–3.1)
Lymphs: 30 %
MCH: 34.9 pg — ABNORMAL HIGH (ref 26.6–33.0)
MCHC: 33.5 g/dL (ref 31.5–35.7)
MCV: 104 fL — ABNORMAL HIGH (ref 79–97)
Monocytes Absolute: 0.9 x10E3/uL (ref 0.1–0.9)
Monocytes: 19 %
Neutrophils Absolute: 2.2 x10E3/uL (ref 1.4–7.0)
Neutrophils: 44 %
Platelets: 205 x10E3/uL (ref 150–450)
RBC: 4.58 x10E6/uL (ref 4.14–5.80)
RDW: 12.1 % (ref 11.6–15.4)
WBC: 4.9 x10E3/uL (ref 3.4–10.8)

## 2024-05-09 LAB — LIPID PANEL
Chol/HDL Ratio: 3.1 ratio (ref 0.0–5.0)
Cholesterol, Total: 210 mg/dL — ABNORMAL HIGH (ref 100–199)
HDL: 68 mg/dL (ref >39)
LDL Chol Calc (NIH): 123 mg/dL — ABNORMAL HIGH (ref 0–99)
Triglycerides: 109 mg/dL (ref 0–149)
VLDL Cholesterol Cal: 19 mg/dL (ref 5–40)

## 2024-05-09 LAB — URIC ACID: Uric Acid: 7.4 mg/dL (ref 3.8–8.4)

## 2024-05-09 LAB — TSH: TSH: 2.81 u[IU]/mL (ref 0.450–4.500)

## 2024-05-09 LAB — HEMOGLOBIN A1C
Est. average glucose Bld gHb Est-mCnc: 105 mg/dL
Hgb A1c MFr Bld: 5.3 % (ref 4.8–5.6)

## 2024-05-09 LAB — PSA: Prostate Specific Ag, Serum: 2 ng/mL (ref 0.0–4.0)

## 2024-05-13 LAB — B12 AND FOLATE PANEL
Folate: 5 ng/mL (ref >3.0)
Vitamin B-12: 755 pg/mL (ref 232–1245)

## 2024-05-13 LAB — SPECIMEN STATUS REPORT

## 2024-05-20 ENCOUNTER — Telehealth: Payer: Self-pay

## 2024-05-20 ENCOUNTER — Other Ambulatory Visit: Payer: Self-pay

## 2024-05-20 DIAGNOSIS — Z1211 Encounter for screening for malignant neoplasm of colon: Secondary | ICD-10-CM

## 2024-05-20 MED ORDER — NA SULFATE-K SULFATE-MG SULF 17.5-3.13-1.6 GM/177ML PO SOLN: 1.0000 | Freq: Once | ORAL | 0 refills | Status: AC

## 2024-05-20 NOTE — Telephone Encounter (Signed)
 Gastroenterology Pre-Procedure Review  Request Date: 06/23/24 Requesting Physician: Dr. Melany  PATIENT REVIEW QUESTIONS: The patient responded to the following health history questions as indicated:    1. Are you having any GI issues? Not currently 2. Do you have a personal history of Polyps? no 3. Do you have a family history of Colon Cancer or Polyps? yes (cousin recently diagnosed with rectal cancer) 4. Diabetes Mellitus? no 5. Joint replacements in the past 12 months?no 6. Major health problems in the past 3 months?01/06/24 Urgent Care visit rectal bleeding, hemorrhoids 7. Any artificial heart valves, MVP, or defibrillator?no    MEDICATIONS & ALLERGIES:    Patient reports the following regarding taking any anticoagulation/antiplatelet therapy:   Plavix, Coumadin, Eliquis, Xarelto, Lovenox, Pradaxa, Brilinta, or Effient? no Aspirin? no  Patient confirms/reports the following medications:  No current outpatient medications on file.   No current facility-administered medications for this visit.    Patient confirms/reports the following allergies:  Allergies  Allergen Reactions   Hydrochlorothiazide     Other Reaction(s): Other (See Comments)  Gout    No orders of the defined types were placed in this encounter.   AUTHORIZATION INFORMATION Primary Insurance: 1D#: Group #:  Secondary Insurance: 1D#: Group #:  SCHEDULE INFORMATION: Date: 06/23/24 Time: Location: [facility]

## 2024-06-06 ENCOUNTER — Encounter: Payer: Self-pay | Admitting: Physician Assistant

## 2024-06-06 ENCOUNTER — Ambulatory Visit: Admitting: Physician Assistant

## 2024-06-06 VITALS — HR 79 | Temp 98.0°F | Ht 67.0 in | Wt 233.0 lb

## 2024-06-06 DIAGNOSIS — G4733 Obstructive sleep apnea (adult) (pediatric): Secondary | ICD-10-CM

## 2024-06-06 DIAGNOSIS — K760 Fatty (change of) liver, not elsewhere classified: Secondary | ICD-10-CM

## 2024-06-06 DIAGNOSIS — Z23 Encounter for immunization: Secondary | ICD-10-CM | POA: Diagnosis not present

## 2024-06-06 DIAGNOSIS — F1020 Alcohol dependence, uncomplicated: Secondary | ICD-10-CM

## 2024-06-06 DIAGNOSIS — R7989 Other specified abnormal findings of blood chemistry: Secondary | ICD-10-CM

## 2024-06-06 HISTORY — DX: Alcohol dependence, uncomplicated: F10.20

## 2024-06-06 NOTE — Progress Notes (Signed)
 "   Date:  06/06/2024   Name:  Roger Huerta   DOB:  12/28/1965   MRN:  [id number]   Chief Complaint: Alcohol use disorder, severe, dependence (HCC (Has not drunk any alcohol today )  HPI  Trip presents today for 1 month follow-up on alcohol use disorder.  Last we discussed, he requested to try bupropion  to help and quit both alcohol and tobacco; he obtained this prescription but never used it.  Despite my encouragement to start it a month ago, he still has it in his possession.  He plans to start January 1.  During the week, he drinks 3-4 beers every other day and on weekends he drinks about 12 beers each on Saturday and "Sunday.  Also due for Tdap and Shingrix  Medication list has been reviewed and updated.  Active Medications[1]   Review of Systems  Patient Active Problem List   Diagnosis Date Noted   Deviated nasal septum 05/08/2024   Tinea pedis of both feet 05/08/2024   Onychomycosis 05/08/2024   Chronic gout of multiple sites 01/04/2023   Alcohol use disorder, severe, dependence (HCC) 01/03/2023   Hepatic steatosis 01/03/2023   OSA (obstructive sleep apnea) 04/18/2016   Hyperlipidemia 06/01/2014   Elevated LFTs 06/01/2014   Class 2 severe obesity with serious comorbidity in adult 05/20/2014   Varicose vein of leg 05/20/2014   Elevated blood pressure reading 05/20/2014    Allergies[2]  Immunization History  Administered Date(s) Administered   Influenza, Seasonal, Injecte, Preservative Fre 05/08/2024   Influenza,inj,Quad PF,6+ Mos 05/20/2014, 04/18/2016   PNEUMOCOCCAL CONJUGATE-20 05/08/2024   Tdap 57/07/2013, 57/19/2025   Zoster Recombinant(Shingrix) 06/06/2024    History reviewed. No pertinent surgical history.  Social History[3]  Family History  Problem Relation Age of Onset   Cancer Mother    Alzheimer's disease Mother    Stroke Father    Hypertension Father    Heart disease Father    Diabetes Father         06/06/2024    2:18 PM  05/08/2024    2:56 PM 02/21/2023   10:40 AM 01/03/2023    3:14 PM  GAD 7 : Generalized Anxiety Score  Nervous, Anxious, on Edge 0 0 0 0  Control/stop worrying 0 0 0 0  Worry too much - different things 0 0 0 1  Trouble relaxing 0 0 0 0  Restless 0 0 0 0  Easily annoyed or irritable 0 0 0 1  Afraid - awful might happen 0 0 0 0  Total GAD 7 Score 0 0 0 2  Anxiety Difficulty Not difficult at all Not difficult at all Not difficult at all Not difficult at all       12" /19/2025    2:17 PM 05/08/2024    2:56 PM 02/21/2023   10:40 AM  Depression screen PHQ 2/9  Decreased Interest 0 0 0  Down, Depressed, Hopeless 0 0 0  PHQ - 2 Score 0 0 0  Altered sleeping   0  Tired, decreased energy   1  Change in appetite   0  Feeling bad or failure about yourself    0  Trouble concentrating   0  Moving slowly or fidgety/restless   0  Suicidal thoughts   0  PHQ-9 Score   1   Difficult doing work/chores   Not difficult at all     Data saved with a previous flowsheet row definition    BP Readings from Last 3 Encounters:  05/08/24 110/72  01/06/24 (!) 165/84  02/21/23 110/78    Wt Readings from Last 3 Encounters:  06/06/24 233 lb (105.7 kg)  05/08/24 240 lb (108.9 kg)  01/06/24 220 lb 0.3 oz (99.8 kg)    Pulse 79   Temp 98 F (36.7 C)   Ht 5' 7 (1.702 m)   Wt 233 lb (105.7 kg)   SpO2 96%   BMI 36.49 kg/m   Physical Exam Vitals and nursing note reviewed.  Constitutional:      Appearance: Normal appearance.  Cardiovascular:     Rate and Rhythm: Normal rate.  Pulmonary:     Effort: Pulmonary effort is normal.  Abdominal:     General: There is no distension.  Musculoskeletal:        General: Normal range of motion.  Skin:    General: Skin is warm and dry.  Neurological:     Mental Status: He is alert and oriented to person, place, and time.     Gait: Gait is intact.  Psychiatric:        Mood and Affect: Mood and affect normal.     Comments: Patient seems physically  restless     Recent Labs     Component Value Date/Time   NA 139 05/08/2024 1616   K 4.1 05/08/2024 1616   CL 99 05/08/2024 1616   CO2 22 05/08/2024 1616   GLUCOSE 99 05/08/2024 1616   GLUCOSE 104 (H) 03/19/2020 1702   BUN 5 (L) 05/08/2024 1616   CREATININE 0.65 (L) 05/08/2024 1616   CALCIUM 9.0 05/08/2024 1616   PROT 6.6 05/08/2024 1616   ALBUMIN 4.1 05/08/2024 1616   AST 162 (H) 05/08/2024 1616   ALT 64 (H) 05/08/2024 1616   ALKPHOS 77 05/08/2024 1616   BILITOT 0.9 05/08/2024 1616   GFRNONAA >60 03/19/2020 1702   GFRAA >60 03/19/2020 1702    Lab Results  Component Value Date   WBC 4.9 05/08/2024   HGB 16.0 05/08/2024   HCT 47.8 05/08/2024   MCV 104 (H) 05/08/2024   PLT 205 05/08/2024   Lab Results  Component Value Date   HGBA1C 5.3 05/08/2024   Lab Results  Component Value Date   CHOL 210 (H) 05/08/2024   HDL 68 05/08/2024   LDLCALC 123 (H) 05/08/2024   TRIG 109 05/08/2024   CHOLHDL 3.1 05/08/2024   Lab Results  Component Value Date   TSH 2.810 05/08/2024      Assessment and Plan:  Alcohol use disorder, severe, dependence (HCC) Assessment & Plan: Encouraged to try bupropion  January 1 as previously discussed.  We reviewed that this medication is not often used for alcohol use disorder, mainly for tobacco, but it might help.  If he does not derive much benefit from using this medication, will probably try naltrexone instead.  Short interval follow-up in about a month.  Orders: -     US  ABDOMEN LIMITED RUQ (LIVER/GB); Future  OSA (obstructive sleep apnea) Assessment & Plan: Discussed ordering repeat sleep study and machine calibration. [facility] does not take [employer].  Will submit alternative referral.  Orders: -     Ambulatory referral to Sleep Studies  Hepatic steatosis Assessment & Plan: Check hepatic ultrasound for updated imaging  Orders: -     US  ABDOMEN LIMITED RUQ (LIVER/GB); Future  Elevated LFTs -     US  ABDOMEN LIMITED RUQ  (LIVER/GB); Future  Encounter for immunization -     Tdap vaccine greater than or equal to 7yo IM -  Varicella-zoster vaccine IM  Patient received 2 immunizations today.  Emphasized the importance of avoiding alcohol tonight and tomorrow while his body responds to the vaccine.   No follow-ups on file.    Rolan Hoyle, PA-C, DMSc, DipACLM, Nutritionist [facility] [phone]      [1]  No outpatient medications have been marked as taking for the 06/06/24 encounter (Office Visit) with Hoyle Toribio SQUIBB, PA.  [2]  Allergies Allergen Reactions   Hydrochlorothiazide     Other Reaction(s): Other (See Comments)  Gout  [3]  Social History Tobacco Use   Smoking status: Never   Smokeless tobacco: Current    Types: Chew   Tobacco comments:    Dip  Vaping Use   Vaping status: Never Used  Substance Use Topics   Alcohol use: Yes    Alcohol/week: 4.0 standard drinks of alcohol    Types: 4 Cans of beer per week    Comment: 3-4 beers every other day, 12-pack each Sat/Sun   Drug use: Not Currently   "

## 2024-06-06 NOTE — Assessment & Plan Note (Signed)
 Discussed ordering repeat sleep study and machine calibration. Roger Huerta does not take [employer].  Will submit alternative referral.

## 2024-06-06 NOTE — Assessment & Plan Note (Signed)
 Check hepatic ultrasound for updated imaging

## 2024-06-06 NOTE — Assessment & Plan Note (Signed)
 Encouraged to try bupropion  January 1 as previously discussed.  We reviewed that this medication is not often used for alcohol use disorder, mainly for tobacco, but it might help.  If he does not derive much benefit from using this medication, will probably try naltrexone instead.  Short interval follow-up in about a month.

## 2024-06-17 ENCOUNTER — Ambulatory Visit
Admission: RE | Admit: 2024-06-17 | Discharge: 2024-06-17 | Disposition: A | Discharge status: Home / Self Care | Source: Ambulatory Visit | Attending: Physician Assistant | Admitting: Physician Assistant

## 2024-06-17 DIAGNOSIS — F1020 Alcohol dependence, uncomplicated: Secondary | ICD-10-CM | POA: Diagnosis present

## 2024-06-17 DIAGNOSIS — R7989 Other specified abnormal findings of blood chemistry: Secondary | ICD-10-CM | POA: Diagnosis present

## 2024-06-17 DIAGNOSIS — K760 Fatty (change of) liver, not elsewhere classified: Secondary | ICD-10-CM | POA: Diagnosis present

## 2024-06-18 ENCOUNTER — Encounter: Payer: Self-pay | Admitting: Gastroenterology

## 2024-06-18 NOTE — Anesthesia Preprocedure Evaluation (Addendum)
"                                    Anesthesia Evaluation  Patient identified by MRN, date of birth, ID band Patient awake    Reviewed: Allergy & Precautions, NPO status , Patient's Chart, lab work & pertinent test results  Airway Mallampati: III  TM Distance: >3 FB Neck ROM: full    Dental  (+) Chipped   Pulmonary sleep apnea and Continuous Positive Airway Pressure Ventilation    Pulmonary exam normal        Cardiovascular hypertension, negative cardio ROS Normal cardiovascular exam     Neuro/Psych  PSYCHIATRIC DISORDERS Anxiety     negative neurological ROS     GI/Hepatic negative GI ROS,,,(+)     substance abuse  alcohol use  Endo/Other  negative endocrine ROS    Renal/GU negative Renal ROS  negative genitourinary   Musculoskeletal   Abdominal   Peds  Hematology negative hematology ROS (+)   Anesthesia Other Findings Past Medical History: 06/06/2024: Alcohol use disorder, severe, dependence (HCC)     Comment:  office note: patient reports 3-4 beers everyother day,               and 12 on Saturday and Sunday No date: Anxiety No date: Chronic gout of multiple sites No date: Class 2 severe obesity with serious comorbidity in adult No date: Deviated nasal septum No date: Elevated LFTs No date: Fatty liver No date: Gout No date: Hypertension No date: Pre-diabetes No date: Sleep apnea     Comment:  doesn't use a CPAP No date: Smoker  History reviewed. No pertinent surgical history.  BMI    Body Mass Index: 36.49 kg/m      Reproductive/Obstetrics negative OB ROS                              Anesthesia Physical Anesthesia Plan  ASA: 2  Anesthesia Plan: General   Post-op Pain Management: Minimal or no pain anticipated   Induction: Intravenous  PONV Risk Score and Plan: 2 and Propofol  infusion and TIVA  Airway Management Planned: Nasal Cannula  Additional Equipment: None  Intra-op Plan:    Post-operative Plan:   Informed Consent: I have reviewed the patients History and Physical, chart, labs and discussed the procedure including the risks, benefits and alternatives for the proposed anesthesia with the patient or authorized representative who has indicated his/her understanding and acceptance.     Dental advisory given  Plan Discussed with: CRNA and Surgeon  Anesthesia Plan Comments: (Discussed risks of anesthesia with patient, including possibility of difficulty with spontaneous ventilation under anesthesia necessitating airway intervention, PONV, and rare risks such as cardiac or respiratory or neurological events, and allergic reactions. Discussed the role of CRNA in patient's perioperative care. Patient understands.)        Anesthesia Quick Evaluation  "

## 2024-06-23 ENCOUNTER — Ambulatory Visit: Payer: Self-pay | Admitting: Anesthesiology

## 2024-06-23 ENCOUNTER — Encounter: Payer: Self-pay | Admitting: Gastroenterology

## 2024-06-23 ENCOUNTER — Other Ambulatory Visit: Payer: Self-pay

## 2024-06-23 ENCOUNTER — Encounter: Admission: RE | Disposition: A | Payer: Self-pay | Source: Home / Self Care | Attending: Gastroenterology

## 2024-06-23 ENCOUNTER — Ambulatory Visit: Payer: Self-pay | Admitting: Physician Assistant

## 2024-06-23 ENCOUNTER — Ambulatory Visit
Admission: RE | Admit: 2024-06-23 | Discharge: 2024-06-23 | Disposition: A | Discharge status: Home / Self Care | Attending: Gastroenterology | Admitting: Gastroenterology

## 2024-06-23 DIAGNOSIS — G4730 Sleep apnea, unspecified: Secondary | ICD-10-CM | POA: Diagnosis not present

## 2024-06-23 DIAGNOSIS — D122 Benign neoplasm of ascending colon: Secondary | ICD-10-CM | POA: Diagnosis not present

## 2024-06-23 DIAGNOSIS — K640 First degree hemorrhoids: Secondary | ICD-10-CM | POA: Insufficient documentation

## 2024-06-23 DIAGNOSIS — D125 Benign neoplasm of sigmoid colon: Secondary | ICD-10-CM | POA: Insufficient documentation

## 2024-06-23 DIAGNOSIS — K5730 Diverticulosis of large intestine without perforation or abscess without bleeding: Secondary | ICD-10-CM | POA: Insufficient documentation

## 2024-06-23 DIAGNOSIS — I10 Essential (primary) hypertension: Secondary | ICD-10-CM | POA: Insufficient documentation

## 2024-06-23 DIAGNOSIS — Z860101 Personal history of adenomatous and serrated colon polyps: Secondary | ICD-10-CM

## 2024-06-23 DIAGNOSIS — K6389 Other specified diseases of intestine: Secondary | ICD-10-CM

## 2024-06-23 DIAGNOSIS — Z1211 Encounter for screening for malignant neoplasm of colon: Secondary | ICD-10-CM | POA: Insufficient documentation

## 2024-06-23 DIAGNOSIS — K635 Polyp of colon: Secondary | ICD-10-CM | POA: Insufficient documentation

## 2024-06-23 HISTORY — DX: Sleep apnea, unspecified: G47.30

## 2024-06-23 HISTORY — DX: Other specified abnormal findings of blood chemistry: R79.89

## 2024-06-23 HISTORY — PX: COLONOSCOPY: SHX5424

## 2024-06-23 HISTORY — DX: Anxiety disorder, unspecified: F41.9

## 2024-06-23 HISTORY — DX: Deviated nasal septum: J34.2

## 2024-06-23 HISTORY — DX: Prediabetes: R73.03

## 2024-06-23 HISTORY — DX: Obesity, class 2: E66.812

## 2024-06-23 HISTORY — PX: POLYPECTOMY: SHX149

## 2024-06-23 HISTORY — DX: Nicotine dependence, unspecified, uncomplicated: F17.200

## 2024-06-23 HISTORY — DX: Fatty (change of) liver, not elsewhere classified: K76.0

## 2024-06-23 HISTORY — DX: Idiopathic chronic gout, multiple sites, without tophus (tophi): M1A.09X0

## 2024-06-23 SURGERY — COLONOSCOPY
Anesthesia: General | Site: Rectum

## 2024-06-23 MED ORDER — PROPOFOL 10 MG/ML IV BOLUS
INTRAVENOUS | Status: DC | PRN
  Administered 2024-06-23: 50 mg via INTRAVENOUS
  Administered 2024-06-23: 100 mg via INTRAVENOUS
  Administered 2024-06-23 (×2): 50 mg via INTRAVENOUS
  Administered 2024-06-23: 30 mg via INTRAVENOUS
  Administered 2024-06-23 (×2): 50 mg via INTRAVENOUS

## 2024-06-23 MED ORDER — STERILE WATER FOR IRRIGATION IR SOLN
Status: DC | PRN
  Administered 2024-06-23: 1

## 2024-06-23 MED ORDER — SODIUM CHLORIDE 0.9 % IV SOLN: INTRAVENOUS | Status: DC

## 2024-06-23 MED ORDER — LIDOCAINE HCL (CARDIAC) PF 100 MG/5ML IV SOSY
PREFILLED_SYRINGE | INTRAVENOUS | Status: DC | PRN
  Administered 2024-06-23: 100 mg via INTRAVENOUS

## 2024-06-23 MED ORDER — STERILE WATER FOR IRRIGATION IR SOLN
Status: DC | PRN
  Administered 2024-06-23: 60 mL

## 2024-06-23 SURGICAL SUPPLY — 8 items
GAUZE SPONGE 4X4 12PLY STRL (GAUZE/BANDAGES/DRESSINGS) IMPLANT
GOWN CVR UNV OPN BCK APRN NK (MISCELLANEOUS) ×4 IMPLANT
KIT PROCEDURE OLYMPUS (MISCELLANEOUS) ×2 IMPLANT
MANIFOLD NEPTUNE II (INSTRUMENTS) ×2 IMPLANT
SNARE COLD EXACTO (MISCELLANEOUS) IMPLANT
SYR 50ML SLIP (SYRINGE) IMPLANT
TRAP ETRAP POLY (MISCELLANEOUS) IMPLANT
WATER STERILE IRR 250ML POUR (IV SOLUTION) ×2 IMPLANT

## 2024-06-23 NOTE — Transfer of Care (Signed)
 Immediate Anesthesia Transfer of Care Note  Patient: Roger Huerta  Procedure(s) Performed: COLONOSCOPY POLYPECTOMY, INTESTINE (Rectum)  Patient Location: PACU  Anesthesia Type: General  Level of Consciousness: awake, alert  and patient cooperative  Airway and Oxygen Therapy: Patient Spontanous Breathing and Patient connected to supplemental oxygen  Post-op Assessment: Post-op Vital signs reviewed, Patient's Cardiovascular Status Stable, Respiratory Function Stable, Patent Airway and No signs of Nausea or vomiting  Post-op Vital Signs: Reviewed and stable  Complications: No notable events documented.

## 2024-06-23 NOTE — Op Note (Signed)
 [facility] Gastroenterology Patient Name: Roger Huerta Procedure Date: 06/23/2024 9:03 AM MRN: [id number] Account #: [id number] Date of Birth: 09/22/1965 Admit Type: Outpatient Age: 59 Room: [facility] OR ROOM 01 Gender: Male Note Status: Finalized Instrument Name: Colonoscope [id number] Procedure:             Colonoscopy Indications:           Screening for colorectal malignant neoplasm Providers:             Clotilda Schaffer, MD Referring MD:          Toribio SQUIBB. Waddell (Referring MD) Medicines:             Propofol  per Anesthesia Complications:         No immediate complications. Procedure:             Pre-Anesthesia Assessment:                        - Prior to the procedure, a History and Physical was                         performed, and patient medications and allergies were                         reviewed. The patient's tolerance of previous                         anesthesia was also reviewed. The risks and benefits                         of the procedure and the sedation options and risks                         were discussed with the patient. All questions were                         answered, and informed consent was obtained. Prior                         Anticoagulants: The patient has taken no anticoagulant                         or antiplatelet agents. ASA Grade Assessment: II - A                         patient with mild systemic disease. After reviewing                         the risks and benefits, the patient was deemed in                         satisfactory condition to undergo the procedure.                        After obtaining informed consent, the colonoscope was                         passed under direct vision. Throughout the procedure,  the patient's blood pressure, pulse, and oxygen                         saturations were monitored continuously. The                         Colonoscope was introduced through the  anus and                         advanced to the the cecum, identified by appendiceal                         orifice and ileocecal valve. The ileocecal valve,                         appendiceal orifice, and rectum were photographed. Findings:      Six sessile polyps were found in the sigmoid colon, descending colon,       ascending colon and cecum. The polyps were 2 to 6 mm in size. These       polyps were removed with a cold snare. Resection and retrieval were       complete. Estimated blood loss: none.      Multiple medium-mouthed diverticula were found in the sigmoid colon.      Internal hemorrhoids were found. The hemorrhoids were Grade I (internal       hemorrhoids that do not prolapse). Impression:            - Six 2 to 6 mm polyps in the sigmoid colon, in the                         descending colon, in the ascending colon and in the                         cecum, removed with a cold snare. Resected and                         retrieved.                        - Diverticulosis in the sigmoid colon.                        - Internal hemorrhoids. Recommendation:        - Patient has a contact number available for                         emergencies. The signs and symptoms of potential                         delayed complications were discussed with the patient.                         Return to normal activities tomorrow. Written                         discharge instructions were provided to the patient.                        - High  fiber diet.                        - Continue present medications.                        - Await pathology results.                        - Repeat colonoscopy in 3 - 5 years for surveillance                         based on pathology results.                        - The findings and recommendations were discussed with                         the designated responsible adult. Procedure Code(s):     --- Professional ---                         [phone], Colonoscopy, flexible; with removal of                         tumor(s), polyp(s), or other lesion(s) by snare                         technique Diagnosis Code(s):     --- Professional ---                        Z12.11, Encounter for screening for malignant neoplasm                         of colon                        D12.5, Benign neoplasm of sigmoid colon                        D12.4, Benign neoplasm of descending colon                        D12.2, Benign neoplasm of ascending colon                        D12.0, Benign neoplasm of cecum                        K64.0, First degree hemorrhoids                        K57.30, Diverticulosis of large intestine without                         perforation or abscess without bleeding CPT copyright 2022 American Medical Association. All rights reserved. The codes documented in this report are preliminary and upon coder review may  be revised to meet current compliance requirements. Clotilda Schaffer, MD 06/23/2024 9:38:42 AM Number of Addenda: 0 Note Initiated On: 06/23/2024 9:03 AM Scope Withdrawal Time: 0 hours 10 minutes 46 seconds  Total Procedure Duration: 0 hours 15 minutes 46  seconds  Estimated Blood Loss:  Estimated blood loss: none.      [facility]

## 2024-06-23 NOTE — Anesthesia Postprocedure Evaluation (Signed)
"   Anesthesia Post Note  Patient: Roger Huerta  Procedure(s) Performed: COLONOSCOPY POLYPECTOMY, INTESTINE (Rectum)  Patient location during evaluation: PACU Anesthesia Type: General Level of consciousness: awake and alert Pain management: pain level controlled Vital Signs Assessment: post-procedure vital signs reviewed and stable Respiratory status: spontaneous breathing, nonlabored ventilation, respiratory function stable and patient connected to nasal cannula oxygen Cardiovascular status: blood pressure returned to baseline and stable Postop Assessment: no apparent nausea or vomiting Anesthetic complications: no   No notable events documented.   Last Vitals:  Vitals:   06/23/24 0940 06/23/24 0945  BP:  135/86  Pulse: 78 76  Resp: 12 16  Temp:  36.8 C  SpO2: 98% 98%    Last Pain:  Vitals:   06/23/24 0945  TempSrc:   PainSc: 0-No pain                 Debby Mines      "

## 2024-06-23 NOTE — H&P (Signed)
 "  Roger Schaffer, MD  [address], [address] [place], [place] [address] Phone: [phone] Fax : [phone]  Primary Care Physician:  Manya Toribio SQUIBB, [place] Primary Gastroenterologist:  Dr. Schaffer  Pre-Procedure History & Physical: HPI:  Roger Huerta is a 59 y.o. male is here for a screening colonoscopy.  Prior colonoscopy?No Fhx CRC? No Blood thinners? None  Past Medical History:  Diagnosis Date   Alcohol use disorder, severe, dependence (HCC) 06/06/2024   office note: patient reports 3-4 beers everyother day, and 12 on Saturday and Sunday   Anxiety    Chronic gout of multiple sites    Class 2 severe obesity with serious comorbidity in adult    Deviated nasal septum    Elevated LFTs    Fatty liver    Gout    Hypertension    Pre-diabetes    Sleep apnea    doesn't use a CPAP   Smoker     History reviewed. No pertinent surgical history.  Prior to Admission medications  Not on File    Allergies as of 05/20/2024 - Review Complete 05/08/2024  Allergen Reaction Noted   Hydrochlorothiazide  05/20/2014    Family History  Problem Relation Age of Onset   Cancer Mother    Alzheimer's disease Mother    Stroke Father    Hypertension Father    Heart disease Father    Diabetes Father     Social History   Socioeconomic History   Marital status: Divorced    Spouse name: Not on file   Number of children: 0   Years of education: Not on file   Highest education level: Not on file  Occupational History   Not on file  Tobacco Use   Smoking status: Never   Smokeless tobacco: Current    Types: Snuff   Tobacco comments:    Dip  Vaping Use   Vaping status: Never Used  Substance and Sexual Activity   Alcohol use: Yes    Alcohol/week: 4.0 standard drinks of alcohol    Types: 4 Cans of beer per week    Comment: 3-4 beers every day, 12-pack each Sat/Sun   Drug use: Yes    Types: Marijuana, Cocaine    Comment: back many years ago   Sexual activity: Not  Currently  Other Topics Concern   Not on file  Social History Narrative   Not on file   Social Drivers of Health   Tobacco Use: High Risk (06/18/2024)   Patient History    Smoking Tobacco Use: Never    Smokeless Tobacco Use: Current    Passive Exposure: Not on file  Financial Resource Strain: Not on file  Food Insecurity: No Food Insecurity (05/08/2024)   Epic    Worried About Programme Researcher, Broadcasting/film/video in the Last Year: Never true    Ran Out of Food in the Last Year: Never true  Transportation Needs: No Transportation Needs (05/08/2024)   Epic    Lack of Transportation (Medical): No    Lack of Transportation (Non-Medical): No  Physical Activity: Not on file  Stress: Not on file  Social Connections: Not on file  Intimate Partner Violence: Not At Risk (05/08/2024)   Epic    Fear of Current or Ex-Partner: No    Emotionally Abused: No    Physically Abused: No    Sexually Abused: No  Depression (PHQ2-9): Low Risk (06/06/2024)   Depression (PHQ2-9)    PHQ-2 Score: 0  Alcohol Screen: Medium  Risk (01/03/2023)   Alcohol Screen    Last Alcohol Screening Score (AUDIT): 11  Housing: Unknown (05/08/2024)   Epic    Unable to Pay for Housing in the Last Year: No    Number of Times Moved in the Last Year: Not on file    Homeless in the Last Year: No  Utilities: Not At Risk (05/08/2024)   Epic    Threatened with loss of utilities: No  Health Literacy: Not on file    Review of Systems: See HPI, otherwise negative ROS  Physical Exam: Ht 5' 7 (1.702 m)   Wt 105.7 kg   BMI 36.49 kg/m  CONSTITUTIONAL: Well-appearing in no acute distress.  HEENT: Pupils equal, round, Extraocular movements intact. Conjunctivae clear NECK: Neck supple CARDIOVASCULAR: Regular rate, no LE edema  RESPIRATORY: No labored breathing  ABDOMEN: Abdomen soft, nontender, not distended, no guarding, no rigidity SKIN: No apparent skin rashes or lesions. NEUROLOGIC: Normal speech, no focal findings. Mental status  alert and oriented x4. PSYCHIATRIC: Mood and affect normal.   Impression/Plan: Roger Huerta is now here to undergo a screening colonoscopy.  Risks, benefits, and alternatives regarding colonoscopy have been reviewed with the patient.  Questions have been answered.  All parties agreeable.  "

## 2024-06-25 ENCOUNTER — Ambulatory Visit: Payer: Self-pay | Admitting: Gastroenterology

## 2024-06-25 LAB — SURGICAL PATHOLOGY

## 2024-07-01 ENCOUNTER — Encounter: Payer: Self-pay | Admitting: Physician Assistant

## 2024-07-11 ENCOUNTER — Encounter: Payer: Self-pay | Admitting: Physician Assistant

## 2024-07-11 ENCOUNTER — Ambulatory Visit: Admitting: Physician Assistant

## 2024-07-11 VITALS — BP 136/82 | HR 81 | Temp 98.8°F | Ht 67.0 in | Wt 236.0 lb

## 2024-07-11 DIAGNOSIS — F1010 Alcohol abuse, uncomplicated: Secondary | ICD-10-CM | POA: Diagnosis not present

## 2024-07-11 NOTE — Assessment & Plan Note (Addendum)
 Congratulated on progress so far, encouraged continued efforts toward alcohol reduction and ideally complete cessation.

## 2024-07-11 NOTE — Progress Notes (Signed)
 "   Date:  07/11/2024   Name:  Roger Huerta   DOB:  May 30, 1966   MRN:  [id number]   Chief Complaint: Medical Management of Chronic Issues (f/u EtOH)  HPI  Trip presents today for 1 month follow-up on alcohol use disorder.  Still has not started bupropion .  Has made some progress on his alcohol reduction.  He tells me he now drinks 2 beers maybe once or twice during the week and on weekends he drinks about 4-6 beers each on Saturday and Sunday.  Completed liver ultrasound 06/17/2024 with increased parenchymal echogenicity consistent with steatosis.  Completed colonoscopy recently with several polyps, 2 of which were tubular adenomas.  Will be repeating in 3 years.  Medication list has been reviewed and updated.  Active Medications[1]   Review of Systems  Patient Active Problem List   Diagnosis Date Noted   History of adenomatous polyp of colon 06/23/2024   Deviated nasal septum 05/08/2024   Tinea pedis of both feet 05/08/2024   Onychomycosis 05/08/2024   Chronic gout of multiple sites 01/04/2023   Alcohol use disorder, mild, abuse 01/03/2023   Hepatic steatosis 01/03/2023   OSA (obstructive sleep apnea) 04/18/2016   Hyperlipidemia 06/01/2014   Elevated LFTs 06/01/2014   Class 2 severe obesity with serious comorbidity in adult 05/20/2014   Varicose vein of leg 05/20/2014   Elevated blood pressure reading 05/20/2014    Allergies[2]  Immunization History  Administered Date(s) Administered   Influenza, Seasonal, Injecte, Preservative Fre 05/08/2024   Influenza,inj,Quad PF,6+ Mos 05/20/2014, 04/18/2016   PNEUMOCOCCAL CONJUGATE-20 05/08/2024   Tdap 05/20/2014, 06/06/2024   Zoster Recombinant(Shingrix ) 06/06/2024    Past Surgical History:  Procedure Laterality Date   COLONOSCOPY N/A 06/23/2024   Procedure: COLONOSCOPY;  Surgeon: Melany Clotilda HERO, MD;  Location: [facility];  Service: Endoscopy;  Laterality: N/A;   POLYPECTOMY  06/23/2024   Procedure:  POLYPECTOMY, INTESTINE;  Surgeon: Melany Clotilda HERO, MD;  Location: [facility];  Service: Endoscopy;;    Social History[3]  Family History  Problem Relation Age of Onset   Cancer Mother    Alzheimer's disease Mother    Stroke Father    Hypertension Father    Heart disease Father    Diabetes Father         07/11/2024    3:15 PM 06/06/2024    2:18 PM 05/08/2024    2:56 PM 02/21/2023   10:40 AM  GAD 7 : Generalized Anxiety Score  Nervous, Anxious, on Edge 0 0  0  0   Control/stop worrying 0 0  0  0   Worry too much - different things 0 0  0  0   Trouble relaxing 0 0  0  0   Restless 0 0  0  0   Easily annoyed or irritable 0 0  0  0   Afraid - awful might happen 0 0  0  0   Total GAD 7 Score 0 0 0 0  Anxiety Difficulty Not difficult at all Not difficult at all Not difficult at all Not difficult at all     Data saved with a previous flowsheet row definition       07/11/2024    3:15 PM 06/06/2024    2:17 PM 05/08/2024    2:56 PM  Depression screen PHQ 2/9  Decreased Interest 0 0 0  Down, Depressed, Hopeless 0 0 0  PHQ - 2 Score 0 0 0    BP Readings  from Last 3 Encounters:  07/11/24 136/82  06/23/24 135/86  05/08/24 110/72    Wt Readings from Last 3 Encounters:  07/11/24 236 lb (107 kg)  06/23/24 228 lb 9.6 oz (103.7 kg)  06/06/24 233 lb (105.7 kg)    BP 136/82   Pulse 81   Temp 98.8 F (37.1 C)   Ht 5' 7 (1.702 m)   Wt 236 lb (107 kg)   SpO2 96%   BMI 36.96 kg/m   Physical Exam Vitals and nursing note reviewed.  Constitutional:      Appearance: Normal appearance.  Cardiovascular:     Rate and Rhythm: Normal rate.  Pulmonary:     Effort: Pulmonary effort is normal.  Abdominal:     General: There is no distension.  Musculoskeletal:        General: Normal range of motion.  Skin:    General: Skin is warm and dry.  Neurological:     Mental Status: He is alert and oriented to person, place, and time.     Gait: Gait is intact.   Psychiatric:        Mood and Affect: Mood and affect normal.     Recent Labs     Component Value Date/Time   NA 139 05/08/2024 1616   K 4.1 05/08/2024 1616   CL 99 05/08/2024 1616   CO2 22 05/08/2024 1616   GLUCOSE 99 05/08/2024 1616   GLUCOSE 104 (H) 03/19/2020 1702   BUN 5 (L) 05/08/2024 1616   CREATININE 0.65 (L) 05/08/2024 1616   CALCIUM 9.0 05/08/2024 1616   PROT 6.6 05/08/2024 1616   ALBUMIN 4.1 05/08/2024 1616   AST 162 (H) 05/08/2024 1616   ALT 64 (H) 05/08/2024 1616   ALKPHOS 77 05/08/2024 1616   BILITOT 0.9 05/08/2024 1616   GFRNONAA >60 03/19/2020 1702   GFRAA >60 03/19/2020 1702    Lab Results  Component Value Date   WBC 4.9 05/08/2024   HGB 16.0 05/08/2024   HCT 47.8 05/08/2024   MCV 104 (H) 05/08/2024   PLT 205 05/08/2024   Lab Results  Component Value Date   HGBA1C 5.3 05/08/2024   Lab Results  Component Value Date   CHOL 210 (H) 05/08/2024   HDL 68 05/08/2024   LDLCALC 123 (H) 05/08/2024   TRIG 109 05/08/2024   CHOLHDL 3.1 05/08/2024   Lab Results  Component Value Date   TSH 2.810 05/08/2024        Assessment & Plan Alcohol use disorder, mild, abuse Congratulated on progress so far, encouraged continued efforts toward alcohol reduction and ideally complete cessation.       Follow-up 1 month OV alcohol use disorder, Shingrix  dose 2   Rolan Hoyle, PA-C, DMSc, DipACLM, Nutritionist [facility] [phone]      [1]  No outpatient medications have been marked as taking for the 07/11/24 encounter (Office Visit) with Hoyle Toribio SQUIBB, PA.  [2]  Allergies Allergen Reactions   Hydrochlorothiazide     Other Reaction(s): Other (See Comments)  Gout  [3]  Social History Tobacco Use   Smoking status: Never   Smokeless tobacco: Current    Types: Snuff   Tobacco comments:    Dip  Vaping Use   Vaping status: Never Used  Substance Use Topics    Alcohol use: Yes    Alcohol/week: 4.0 standard drinks of alcohol    Types: 4 Cans of beer per week  Comment: 3-4 beers every day, 12-pack each Sat/Sun   Drug use: Yes    Types: Marijuana, Cocaine    Comment: back many years ago   "

## 2024-08-08 ENCOUNTER — Ambulatory Visit: Admitting: Physician Assistant

## 2025-05-11 ENCOUNTER — Encounter: Admitting: Physician Assistant
